# Patient Record
Sex: Female | Born: 1975 | Race: Black or African American | Hispanic: No | Marital: Single | Smoking: Former smoker
Health system: Southern US, Community
[De-identification: ages and names within clinical notes are randomized; demographics above are authoritative.]

## PROBLEM LIST (undated history)

## (undated) DIAGNOSIS — I10 Essential (primary) hypertension: Secondary | ICD-10-CM

## (undated) DIAGNOSIS — F329 Major depressive disorder, single episode, unspecified: Secondary | ICD-10-CM

## (undated) DIAGNOSIS — R221 Localized swelling, mass and lump, neck: Secondary | ICD-10-CM

## (undated) DIAGNOSIS — F32A Depression, unspecified: Secondary | ICD-10-CM

## (undated) DIAGNOSIS — D649 Anemia, unspecified: Secondary | ICD-10-CM

## (undated) DIAGNOSIS — R0600 Dyspnea, unspecified: Secondary | ICD-10-CM

## (undated) DIAGNOSIS — E039 Hypothyroidism, unspecified: Secondary | ICD-10-CM

## (undated) DIAGNOSIS — G4730 Sleep apnea, unspecified: Secondary | ICD-10-CM

## (undated) DIAGNOSIS — E079 Disorder of thyroid, unspecified: Secondary | ICD-10-CM

## (undated) DIAGNOSIS — K219 Gastro-esophageal reflux disease without esophagitis: Secondary | ICD-10-CM

## (undated) DIAGNOSIS — E119 Type 2 diabetes mellitus without complications: Secondary | ICD-10-CM

## (undated) HISTORY — PX: TONSILLECTOMY: SUR1361

## (undated) HISTORY — DX: Localized swelling, mass and lump, neck: R22.1

## (undated) HISTORY — PX: TOTAL THYROIDECTOMY: SHX2547

---

## 2004-04-25 ENCOUNTER — Ambulatory Visit: Payer: Self-pay | Admitting: Otolaryngology

## 2004-06-19 ENCOUNTER — Ambulatory Visit: Payer: Self-pay | Admitting: Otolaryngology

## 2004-06-29 ENCOUNTER — Ambulatory Visit: Payer: Self-pay | Admitting: Otolaryngology

## 2004-07-16 ENCOUNTER — Emergency Department: Payer: Self-pay | Admitting: Emergency Medicine

## 2005-09-29 ENCOUNTER — Emergency Department: Payer: Self-pay | Admitting: Internal Medicine

## 2006-02-27 ENCOUNTER — Ambulatory Visit: Payer: Self-pay

## 2006-04-07 ENCOUNTER — Observation Stay: Payer: Self-pay | Admitting: Unknown Physician Specialty

## 2006-04-19 ENCOUNTER — Inpatient Hospital Stay: Payer: Self-pay | Admitting: Unknown Physician Specialty

## 2006-08-05 ENCOUNTER — Emergency Department: Payer: Self-pay | Admitting: Emergency Medicine

## 2007-09-15 ENCOUNTER — Emergency Department: Payer: Self-pay | Admitting: Emergency Medicine

## 2007-11-25 ENCOUNTER — Emergency Department: Payer: Self-pay | Admitting: Emergency Medicine

## 2008-10-11 ENCOUNTER — Emergency Department: Payer: Self-pay | Admitting: Unknown Physician Specialty

## 2011-05-02 ENCOUNTER — Emergency Department: Payer: Self-pay | Admitting: Emergency Medicine

## 2011-10-10 ENCOUNTER — Emergency Department: Payer: Self-pay | Admitting: Emergency Medicine

## 2011-12-29 ENCOUNTER — Emergency Department: Payer: Self-pay | Admitting: Emergency Medicine

## 2012-07-16 DIAGNOSIS — R221 Localized swelling, mass and lump, neck: Secondary | ICD-10-CM

## 2012-07-16 HISTORY — DX: Localized swelling, mass and lump, neck: R22.1

## 2012-07-16 HISTORY — PX: NECK MASS EXCISION: SHX2079

## 2012-07-16 HISTORY — PX: CHOLECYSTECTOMY: SHX55

## 2012-10-09 ENCOUNTER — Emergency Department: Payer: Self-pay | Admitting: Emergency Medicine

## 2012-10-09 LAB — CBC
HCT: 31.2 % — ABNORMAL LOW (ref 35.0–47.0)
MCH: 19.6 pg — ABNORMAL LOW (ref 26.0–34.0)
MCHC: 30.2 g/dL — ABNORMAL LOW (ref 32.0–36.0)
MCV: 65 fL — ABNORMAL LOW (ref 80–100)
WBC: 8.3 10*3/uL (ref 3.6–11.0)

## 2012-10-09 LAB — COMPREHENSIVE METABOLIC PANEL
Albumin: 3.1 g/dL — ABNORMAL LOW (ref 3.4–5.0)
Bilirubin,Total: 0.2 mg/dL (ref 0.2–1.0)
Calcium, Total: 7.9 mg/dL — ABNORMAL LOW (ref 8.5–10.1)
Chloride: 105 mmol/L (ref 98–107)
Creatinine: 0.6 mg/dL (ref 0.60–1.30)
EGFR (African American): >60
Glucose: 62 mg/dL — ABNORMAL LOW (ref 65–99)
Osmolality: 269 (ref 275–301)
SGOT(AST): 17 U/L (ref 15–37)
SGPT (ALT): 17 U/L (ref 12–78)
Total Protein: 8.2 g/dL (ref 6.4–8.2)

## 2012-10-09 LAB — TROPONIN I
Troponin-I: <0.02 ng/mL
Troponin-I: <0.02 ng/mL

## 2012-10-09 LAB — CK TOTAL AND CKMB (NOT AT ARMC)
CK, Total: 128 U/L (ref 21–215)
CK-MB: <0.5 ng/mL — ABNORMAL LOW (ref 0.5–3.6)
CK-MB: <0.5 ng/mL — ABNORMAL LOW (ref 0.5–3.6)

## 2012-10-22 ENCOUNTER — Ambulatory Visit: Payer: Self-pay | Admitting: Emergency Medicine

## 2012-10-22 LAB — HEPATIC FUNCTION PANEL A (ARMC)
Albumin: 3.1 g/dL — ABNORMAL LOW (ref 3.4–5.0)
Alkaline Phosphatase: 92 U/L (ref 50–136)
Bilirubin, Direct: <0.1 mg/dL (ref 0.00–0.20)
Bilirubin,Total: 0.2 mg/dL (ref 0.2–1.0)
SGOT(AST): 16 U/L (ref 15–37)
SGPT (ALT): 14 U/L (ref 12–78)
Total Protein: 7.5 g/dL (ref 6.4–8.2)

## 2012-10-28 ENCOUNTER — Ambulatory Visit: Payer: Self-pay | Admitting: Emergency Medicine

## 2012-11-12 ENCOUNTER — Ambulatory Visit: Payer: Self-pay | Admitting: Unknown Physician Specialty

## 2012-11-18 ENCOUNTER — Ambulatory Visit: Payer: Self-pay | Admitting: Unknown Physician Specialty

## 2012-11-19 LAB — PATHOLOGY REPORT

## 2012-12-04 ENCOUNTER — Ambulatory Visit: Payer: Self-pay | Admitting: Emergency Medicine

## 2012-12-04 LAB — HEPATIC FUNCTION PANEL A (ARMC)
Albumin: 3.1 g/dL — ABNORMAL LOW (ref 3.4–5.0)
Alkaline Phosphatase: 91 U/L (ref 50–136)
SGOT(AST): 13 U/L — ABNORMAL LOW (ref 15–37)
Total Protein: 7.9 g/dL (ref 6.4–8.2)

## 2012-12-11 ENCOUNTER — Inpatient Hospital Stay: Payer: Self-pay | Admitting: Surgery

## 2012-12-12 LAB — CREATININE, SERUM: EGFR (Non-African Amer.): >60

## 2012-12-15 LAB — PATHOLOGY REPORT

## 2012-12-30 ENCOUNTER — Encounter: Payer: Self-pay | Admitting: General Surgery

## 2012-12-30 ENCOUNTER — Other Ambulatory Visit: Payer: Self-pay | Admitting: *Deleted

## 2012-12-30 ENCOUNTER — Other Ambulatory Visit: Payer: Self-pay | Admitting: General Surgery

## 2012-12-30 ENCOUNTER — Ambulatory Visit: Payer: Medicaid Other | Admitting: General Surgery

## 2012-12-30 VITALS — BP 130/74 | HR 74 | Resp 16 | Ht 65.0 in | Wt 338.0 lb

## 2012-12-30 DIAGNOSIS — R1011 Right upper quadrant pain: Secondary | ICD-10-CM

## 2012-12-30 DIAGNOSIS — R109 Unspecified abdominal pain: Secondary | ICD-10-CM

## 2012-12-30 LAB — CBC WITH DIFFERENTIAL/PLATELET
Basophil #: 0 10*3/uL (ref 0.0–0.1)
Basophil %: 0.5 %
Eosinophil #: 0.2 10*3/uL (ref 0.0–0.7)
Eosinophil %: 3.1 %
HCT: 27.7 % — ABNORMAL LOW (ref 35.0–47.0)
HGB: 8.6 g/dL — ABNORMAL LOW (ref 12.0–16.0)
Lymphocyte #: 2.5 10*3/uL (ref 1.0–3.6)
Lymphocyte %: 40 %
MCHC: 31.1 g/dL — ABNORMAL LOW (ref 32.0–36.0)
Monocyte #: 0.3 x10 3/mm (ref 0.2–0.9)
Monocyte %: 5.3 %
Neutrophil #: 3.2 10*3/uL (ref 1.4–6.5)
Neutrophil %: 51.1 %
Platelet: 364 10*3/uL (ref 150–440)
RDW: 18.9 % — ABNORMAL HIGH (ref 11.5–14.5)

## 2012-12-30 LAB — HEPATIC FUNCTION PANEL A (ARMC)
Bilirubin, Direct: <0.1 mg/dL (ref 0.00–0.20)
Bilirubin,Total: 0.2 mg/dL (ref 0.2–1.0)
SGPT (ALT): 15 U/L (ref 12–78)

## 2012-12-30 LAB — LIPASE, BLOOD: Lipase: 120 U/L (ref 73–393)

## 2012-12-30 NOTE — Patient Instructions (Addendum)
Follow up labs at [facility] and if needed ultrasound Will call with results

## 2012-12-30 NOTE — Progress Notes (Signed)
Patient ID: Sierra Pineda, female   DOB: 03/22/1976, 37 y.o.   MRN: [id number]  Chief Complaint  Patient presents with  . Routine Post Op    Dr Cecelia Byars    HPI Sierra Pineda is a 37 y.o. female who presents as a new patient. The patient had open gallbladder surgery performed on 12/11/12. Drain was removed with postop visit with Dr Cecelia Byars. States she was doing good until increased pain few days ago. 3 days ago started with throbbing constant pain. Denies fever, chills,nausea or vomiting.  Also, having shoulder discomfort. No nausea or vomiting, no fever or chills HPI  Past Medical History  Diagnosis Date  . Neck mass 2014    Past Surgical History  Procedure Laterality Date  . Neck mass excision  2014    Dr Willeen Cass  . Cholecystectomy  2014    Dr Cecelia Byars    History reviewed. No pertinent family history.  Social History History  Substance Use Topics  . Smoking status: Former Smoker    Quit date: 09/13/2012  . Smokeless tobacco: Not on file  . Alcohol Use: No    Allergies  Allergen Reactions  . Other     chocolate  . Tylenol (Acetaminophen) Rash and Other (See Comments)    Yeast Infection    Current Outpatient Prescriptions  Medication Sig Dispense Refill  . levothyroxine (SYNTHROID, LEVOTHROID) 100 MCG tablet Take 100 mcg by mouth daily before breakfast.       No current facility-administered medications for this visit.    Review of Systems Review of Systems  Constitutional: Negative.   Respiratory: Negative.   Cardiovascular: Negative.   Gastrointestinal: Positive for abdominal pain.    Blood pressure 130/74, pulse 74, resp. rate 16, height 5\' 5"  (1.651 m), weight 338 lb (153.316 kg), last menstrual period 12/22/2012.  Physical Exam Physical Exam  Constitutional: She is oriented to person, place, and time. She appears well-developed and well-nourished.  Eyes: Conjunctivae are normal. No scleral icterus.  Cardiovascular: Normal rate and regular rhythm.    Pulmonary/Chest: Effort normal and breath sounds normal.  Abdominal: There is tenderness in the right upper quadrant.  Neurological: She is alert and oriented to person, place, and time.  Skin: Skin is warm and dry.   Incision clean to abdomen Data Reviewed OP note reviewed-dificult surgery due to pt size and marked acute changes. IOC not done.  Assessment    Abd pain, 2-3 weeks post cholecystectomy.     Plan          Patient to have the following labs drawn at [facility]: CBC, Hepatic Function Panel, and Lipase. This patient will be contacted once results are available. She is aware of all instructions.    SANKAR,SEEPLAPUTHUR G 12/30/2012, 11:23 AM

## 2012-12-30 NOTE — Progress Notes (Signed)
Patient was contacted today to let her know Dr. Evette Cristal did review labs completed at [facility] earlier today. Dr. Evette Cristal is recommending that patient have a limited abdominal ultrasound attention: gallbladder. This has been arranged at [facility] for 12-31-12 at 8:30 am (arrive 8:15 am). Prep: nothing to eat or drink after midnight tonight. She is aware of all instructions.

## 2012-12-31 ENCOUNTER — Telehealth: Payer: Self-pay | Admitting: General Surgery

## 2012-12-31 ENCOUNTER — Ambulatory Visit: Payer: Self-pay | Admitting: General Surgery

## 2012-12-31 NOTE — Telephone Encounter (Signed)
Pt's labs showed Hgb of 8.6-could not locate one preop. LFTs and Lipase normal. US showed no ductal dilatation , no ascites or fluid collections. Pt advised -no apparent findings for her pain. Likely this is incisional. Advised use of Ibuprofen for 4-5 days. Call for any worsening or new symptoms. Return to work delayed by 1 week.

## 2013-01-04 NOTE — Progress Notes (Signed)
Lipase was reported with chemistry panel. This is not overdue

## 2013-12-02 ENCOUNTER — Ambulatory Visit: Payer: Self-pay | Admitting: Family Medicine

## 2014-02-05 ENCOUNTER — Ambulatory Visit: Payer: Self-pay | Admitting: Primary Care

## 2014-05-17 ENCOUNTER — Encounter: Payer: Self-pay | Admitting: General Surgery

## 2014-11-05 NOTE — Op Note (Signed)
PATIENT NAME:  Sierra Pineda, SCHLUETER MR#:  [id number] DATE OF BIRTH:  September 23, 1975  DATE OF PROCEDURE:  12/11/2012  PREOPERATIVE DIAGNOSES:  1. Acute cholecystitis.  2. Obesity.   OPERATION: Attempted laparoscopic but then could not do it and did open cholecystectomy.   SURGEON: Hetal Proano S. Cecelia Byars, M.D.   INDICATION FOR SURGERY: This patient, who is over 350 pounds, is an obese woman. She had also very large goiter and the previous surgery she underwent resection of her thyroid gland so because she was having a lot of pressure symptoms on her trachea. She was then brought to surgery because she was having right upper quadrant abdominal pain and had gallstones. The patient is very massively obese, and I told her that there is a problem, that I might not be able to enter the abdomen through for laparoscopic procedure because there is so thick fat around the bellybutton.   DESCRIPTION OF PROCEDURE: So, the patient was then brought to surgery. After she was put to sleep, a small incision was made above the umbilicus. After cutting skin and subcutaneous tissue, we kept on dissecting down to the fascia. We could not even reach the fascia. Then, I tried with a Veress needle also, but I was not sure whether it was going into the abdomen or not. Because of the size of the abdomen, it was not clicking, so I tried to use the other trocar but I was afraid to push it because I was not very sure that it was at the right place. So, I decided because of the obesity and because of this, it was better to do the open cholecystectomy. I made an incision in the right upper quadrant. After cutting skin and subcutaneous tissue, the patient had tremendous fat. I dissected down to the fascia, opened the fascia, opened the peritoneum. After entering the abdomen, the patient had a very large liver, and the gallbladder was way down. It was very difficult to even remove. A lot of omentum was stuck. It was dissected off the gallbladder and  pushed down, and Hartmann retractor was then put in and slowly and slowly dissection was done from the liver bed. I went slowly and kept using Bovie until I reached the lower end of the gallbladder, and I made a hole in the gallbladder and lots of stones fell out. Dissected down to the cystic arteries, which were clipped, and way down into the gallbladder Hartmann pouch area and it was really one of a difficult surgery. It was very, very deep. I clamped gall bladder below hartman pouch  and took out the rest of the stones and then made sure that we were not near the common duct and then we put 2 clips. Then, I also put a tie of 0 chromic on a needlestick because it was too low to even tie with hand, but I did a stick tie and then tied it with had. Made sure there was no leakage. The oozing from the liver was stopped with pressure and Bovie. Irrigation of the abdomen was performed which showed there was no biliary leak and no bleeding. Then, I put a drain there brought out from a separate stab wound. The peritoneum was then closed with running Vicryl after nurses told me the sponge count was correct. Then, the fascia was closed with interrupted 0 Vicryl sutures, skin with skin clips. The umbilical port was also then closed.   SUMMARY OF THE CASE: The patient was difficult just because of her  massive obesity and very deep amount of fat under the skin and subcutaneous tissue and even open gallbladder was very difficult to do because of the size of the patient. In the end, the patient did very well and was sent to the recovery room in satisfactory condition.   ____________________________ Alton RevereMasud S. Cecelia ByarsHashmi, MD msh:gb D: 12/11/2012 14:20:43 ET T: 12/12/2012 01:50:41 ET JOB#: [id number]  cc: Sully Manzi S. Cecelia ByarsHashmi, MD, <Dictator> Meryle ReadyMASUD S Cledith Kamiya MD ELECTRONICALLY SIGNED 01/14/2013 16:15

## 2014-11-05 NOTE — Op Note (Signed)
PATIENT NAME:  Sierra Pineda, Sierra Pineda MR#:  [id number] DATE OF BIRTH:  25-Feb-1976  DATE OF PROCEDURE:  11/18/2012  PREOPERATIVE DIAGNOSES: Diffuse goiter.   POSTOPERATIVE DIAGNOSIS:  Diffuse goiter.  PROCEDURE PERFORMED: Total thyroidectomy with substernal component.  SURGEON:  Linus Salmonshapman Lashaunta Sicard, MD  ASSISTANT:  Bud Facereighton Vaught, MD  OPERATIVE FINDINGS: A large goiter involving the majority of the left lobe of the thyroid which measured approximately 9 x 10 cm, right lobe of the thyroid relatively more normal at 2 x 2.5 cm.   DESCRIPTION OF PROCEDURE: Helmut Musterlicia was identified in the holding area and taken to the operating room and placed in the supine position. After general endotracheal anesthesia with a laryngeal monitoring endotracheal tube, the patient's neck was gently extended. An incision line was marked along a skin crease.  A local anesthetic of 1% lidocaine with 1:100,000 units of epinephrine was used to inject along this incision line. A total of 9 mL was used. With the neck prepped and draped sterilely, a 15 blade was used to incise down to and through the platysma muscle. Hemostasis was achieved using the Bovie cautery.  Superiorly and inferiorly based subplatysmal flaps were then elevated. The large left lobe of the thyroid was compressing the trachea over to the right. The strap muscle midline was identified. This was further over to the right than usual and the strap muscles were divided in the midline and beginning on the left-hand side were retracted laterally. There was a very large mass involving the entire left lobe of the thyroid.  Gentle dissection was taken around this. It was evident that there was no way that this large goiter could be pulled through the strap muscles; therefore, it was decided the strap muscles would be divided. Therefore, clamps were placed on the strap muscles and the Bovie was used to divide these. The clamps were left in position for reapproximation. This gave  excellent exposure to the gland.  Careful dissection was then taken over the gland. This was dissected inferiorly and indeed had a substernal component. There were several fibrous bands which were divided using the Harmonic scalpel. The gland was gently medialized and brought into the wound. It measured approximately 10 x 9 cm with a nice capsule around it.  As the gland was medialized, again there were several feeding vessels which were divided using the Harmonic scalpel. As the gland was more medialized, the superior and inferior parathyroid glands were identified and preserved on their vascular pedicles. The recurrent laryngeal nerve was identified in the tracheoesophageal groove. This was stimulated and remained intact throughout the case. The superior pole vessels were then isolated and divided using the Harmonic scalpel which allowed this large left lobe of the gland to be peeled over the anterior wall of the trachea. Berry's ligament was divided using the microbipolar and the gland was more medialized. It was felt in order to get the right lobe out with this very large mass on the left-hand side that we would divide the isthmus. Therefore, the Harmonic was used to divide the gland and separate it at the isthmus. With the left lobe removed, a stitch was placed in the superior pole for marking. Once this was removed, the trachea came back to the midline which gave excellent exposure to the right lobe.  As the strap muscles were retracted on the right, the lateral aspect of the gland was identified. The superior pole vessels were identified, isolated and divided using the Harmonic scalpel. There were multiple small feeding  vessels into the gland, which were also divided using the Harmonic scalpel. As the gland was gently medialized, the superior and inferior parathyroid glands again were identified, peeled gently off the gland and left on their vascular pedicles.  The recurrent laryngeal nerve was identified in  the tracheoesophageal groove. This was stimulated and remained intact throughout the case. Several small vessels again were headed into the gland just lateral to the nerve.  These were divided using the Harmonic scalpel. The gland was then retracted medially, Berry's ligament was divided, and the right lobe of the thyroid plus the isthmus was divided and dissected off the anterior wall of the trachea. With the gland removed, the wound was copiously irrigated with saline. Any small bleeding points were cauterized using the microbipolar. Surgicel was then placed in the neurovascular bed bilaterally. Prior to this, both recurrent laryngeal nerves were stimulated and reacted normally.  On the left-hand side, where the strap muscles had been divided, these muscles were reapproximated using interrupted 4-0 Vicryls.  Two #10 TLS drains were brought out of the wound inferiorly. The strap muscles were then reapproximated in the midline anteriorly.  The platysmal layer was closed using 4-0 Vicryl. The subcutaneous tissues were also closed using 4-0 Vicryl, and the skin was closed using Dermabond. Tegaderms were then used to affix the TLS drains inferiorly. The patient was then returned to anesthesia where she was awakened in the operating room and taken to the recovery room in stable condition.   CULTURES: None.          SPECIMENS: Total thyroid gland.   ESTIMATED BLOOD LOSS: Less than 20 mL. ____________________________ Davina Poke, MD ctm:sb D: 11/18/2012 09:29:23 ET T: 11/18/2012 09:43:29 ET JOB#: [id number]  cc: Davina Poke, MD, <Dictator> Davina Poke MD ELECTRONICALLY SIGNED 11/28/2012 7:36

## 2015-10-10 ENCOUNTER — Emergency Department
Admission: EM | Admit: 2015-10-10 | Discharge: 2015-10-10 | Disposition: A | Discharge status: Home / Self Care | Payer: Self-pay | Attending: Emergency Medicine | Admitting: Emergency Medicine

## 2015-10-10 ENCOUNTER — Emergency Department: Payer: Self-pay

## 2015-10-10 ENCOUNTER — Encounter: Payer: Self-pay | Admitting: Emergency Medicine

## 2015-10-10 DIAGNOSIS — E119 Type 2 diabetes mellitus without complications: Secondary | ICD-10-CM | POA: Insufficient documentation

## 2015-10-10 DIAGNOSIS — R42 Dizziness and giddiness: Secondary | ICD-10-CM | POA: Insufficient documentation

## 2015-10-10 DIAGNOSIS — F439 Reaction to severe stress, unspecified: Secondary | ICD-10-CM | POA: Insufficient documentation

## 2015-10-10 DIAGNOSIS — I10 Essential (primary) hypertension: Secondary | ICD-10-CM | POA: Insufficient documentation

## 2015-10-10 DIAGNOSIS — R109 Unspecified abdominal pain: Secondary | ICD-10-CM | POA: Insufficient documentation

## 2015-10-10 DIAGNOSIS — R202 Paresthesia of skin: Secondary | ICD-10-CM | POA: Insufficient documentation

## 2015-10-10 DIAGNOSIS — Z87891 Personal history of nicotine dependence: Secondary | ICD-10-CM | POA: Insufficient documentation

## 2015-10-10 DIAGNOSIS — Z79899 Other long term (current) drug therapy: Secondary | ICD-10-CM | POA: Insufficient documentation

## 2015-10-10 HISTORY — DX: Essential (primary) hypertension: I10

## 2015-10-10 HISTORY — DX: Type 2 diabetes mellitus without complications: E11.9

## 2015-10-10 LAB — CBC
HCT: 35.7 % (ref 35.0–47.0)
HEMOGLOBIN: 11.5 g/dL — AB (ref 12.0–16.0)
MCH: 24.1 pg — ABNORMAL LOW (ref 26.0–34.0)
MCHC: 32.3 g/dL (ref 32.0–36.0)
MCV: 74.6 fL — ABNORMAL LOW (ref 80.0–100.0)
Platelets: 276 10*3/uL (ref 150–440)
RBC: 4.78 MIL/uL (ref 3.80–5.20)
RDW: 16.5 % — ABNORMAL HIGH (ref 11.5–14.5)
WBC: 7.3 10*3/uL (ref 3.6–11.0)

## 2015-10-10 LAB — URINALYSIS COMPLETE WITH MICROSCOPIC (ARMC ONLY)
Bilirubin Urine: NEGATIVE
Glucose, UA: NEGATIVE mg/dL
KETONES UR: NEGATIVE mg/dL
Leukocytes, UA: NEGATIVE
NITRITE: NEGATIVE
PH: 6 (ref 5.0–8.0)
PROTEIN: NEGATIVE mg/dL
SPECIFIC GRAVITY, URINE: 1.019 (ref 1.005–1.030)

## 2015-10-10 LAB — BASIC METABOLIC PANEL
ANION GAP: 4 — AB (ref 5–15)
BUN: 8 mg/dL (ref 6–20)
CALCIUM: 8.3 mg/dL — AB (ref 8.9–10.3)
CO2: 29 mmol/L (ref 22–32)
Chloride: 102 mmol/L (ref 101–111)
Creatinine, Ser: 0.93 mg/dL (ref 0.44–1.00)
Glucose, Bld: 157 mg/dL — ABNORMAL HIGH (ref 65–99)
Potassium: 3.6 mmol/L (ref 3.5–5.1)
Sodium: 135 mmol/L (ref 135–145)

## 2015-10-10 LAB — GLUCOSE, CAPILLARY: Glucose-Capillary: 164 mg/dL — ABNORMAL HIGH (ref 65–99)

## 2015-10-10 MED ORDER — GI COCKTAIL ~~LOC~~
30.0000 mL | Freq: Once | ORAL | Status: AC
  Administered 2015-10-10: 30 mL via ORAL
  Filled 2015-10-10: qty 30

## 2015-10-10 MED ORDER — MECLIZINE HCL 25 MG PO TABS: 25.0000 mg | ORAL_TABLET | Freq: Three times a day (TID) | ORAL | Status: DC | PRN

## 2015-10-10 MED ORDER — MECLIZINE HCL 25 MG PO TABS
25.0000 mg | ORAL_TABLET | Freq: Once | ORAL | Status: AC
  Administered 2015-10-10: 25 mg via ORAL
  Filled 2015-10-10: qty 1

## 2015-10-10 NOTE — ED Provider Notes (Signed)
[facility] Emergency Department Provider Note  ____________________________________________    I have reviewed the triage vital signs and the nursing notes.   HISTORY  Chief Complaint Dizziness and Numbness    HPI Sierra Pineda is a 40 y.o. female presents with multiple complaints. She reports she has had a sensation of room spinning which started today. She denies headaches. No neuro deficits. No history of vertigo in the past. She is not taking anything for this. She also complains of a tingling sensation in her abdomen area. She denies nausea or vomiting. 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 She feels this has something to do with her symptoms     Past Medical History  Diagnosis Date  . Neck mass 2014  . Diabetes mellitus without complication (HCC)   . Hypertension     Patient Active Problem List   Diagnosis Date Noted  . Abdominal pain, right upper quadrant 12/30/2012    Past Surgical History  Procedure Laterality Date  . Neck mass excision  2014    Dr Willeen Cass  . Cholecystectomy  2014    Dr Cecelia Byars    Current Outpatient Rx  Name  Route  Sig  Dispense  Refill  . levothyroxine (SYNTHROID, LEVOTHROID) 100 MCG tablet   Oral   Take 100 mcg by mouth daily before breakfast.         . meclizine (ANTIVERT) 25 MG tablet   Oral   Take 1 tablet (25 mg total) by mouth 3 (three) times daily as needed for dizziness.   20 tablet   0     Allergies Other and Tylenol  No family history on file.  Social History Social History  Substance Use Topics  . Smoking status: Former Smoker    Quit date: 09/13/2012  . Smokeless tobacco: None  . Alcohol Use: No    Review of Systems  Constitutional: Negative for fever. Eyes: Negative for redness ENT: Negative for sore throat Cardiovascular: Negative for chest pain Respiratory: Negative for shortness of  breath. Gastrointestinal:Tingling sensation as above Genitourinary: Negative for dysuria. Musculoskeletal: Negative for back pain. Skin: Negative for rash. Neurological: Negative for focal weakness. Positive dizziness as above Psychiatric: Positive stress    ____________________________________________   PHYSICAL EXAM:  VITAL SIGNS: ED Triage Vitals  Enc Vitals Group     BP 10/10/15 1645 160/96 mmHg     Pulse Rate 10/10/15 1645 92     Resp 10/10/15 1645 18     Temp 10/10/15 1645 98.9 F (37.2 C)     Temp Source 10/10/15 1645 Oral     SpO2 10/10/15 1645 100 %     Weight 10/10/15 1645 370 lb (167.831 kg)     Height 10/10/15 1645  (1.651 Pineda)     Head Cir --      Peak Flow --      Pain Score 10/10/15 1645 7     Pain Loc --      Pain Edu? --      Excl. in GC? --      Constitutional: Alert and oriented. Well appearing and in no distress.  Eyes: Conjunctivae are normal. No erythema or injection. No nystagmus ENT   Head: Normocephalic and atraumatic.   Mouth/Throat: Mucous membranes are moist. Cardiovascular: Normal rate, regular rhythm. Normal and symmetric distal pulses are present in the upper extremities.  Respiratory: Normal respiratory effort without tachypnea  nor retractions. Breath sounds are clear and equal bilaterally.  Gastrointestinal: Soft and non-tender in all quadrants. No distention. There is no CVA tenderness. Genitourinary: deferred Musculoskeletal: Nontender with normal range of motion in all extremities. No lower extremity tenderness nor edema. Neurologic:  Normal speech and language. No gross focal neurologic deficits are appreciated. Cranial nerves II-12 are normal. Normal gait. Skin:  Skin is warm, dry and intact. No rash noted. Psychiatric: Mood and affect are normal. Patient exhibits appropriate insight and judgment.  ____________________________________________    LABS (pertinent positives/negatives)  Labs Reviewed  BASIC METABOLIC  PANEL - Abnormal; Notable for the following:    Glucose, Bld 157 (*)    Calcium 8.3 (*)    Anion gap 4 (*)    All other components within normal limits  CBC - Abnormal; Notable for the following:    Hemoglobin 11.5 (*)    MCV 74.6 (*)    MCH 24.1 (*)    RDW 16.5 (*)    All other components within normal limits  URINALYSIS COMPLETEWITH MICROSCOPIC ([facility] ONLY) - Abnormal; Notable for the following:    Color, Urine YELLOW (*)    APPearance HAZY (*)    Hgb urine dipstick 1+ (*)    Bacteria, UA RARE (*)    Squamous Epithelial / LPF 0-5 (*)    All other components within normal limits  GLUCOSE, CAPILLARY - Abnormal; Notable for the following:    Glucose-Capillary 164 (*)    All other components within normal limits  PREGNANCY, URINE  CBG MONITORING, ED    ____________________________________________   EKG  ED ECG REPORT I, Sierra EveryKINNER, Vineeth Fell, the attending physician, personally viewed and interpreted this ECG.  Date: 10/10/2015 EKG Time: 4:52 PM Rate: 82 Rhythm: normal sinus rhythm QRS Axis: normal Intervals: normal ST/T Wave abnormalities: normal Conduction Disturbances: none Narrative Interpretation: unremarkable   ____________________________________________    RADIOLOGY  CT head is unremarkable. Donnatal x-ray is normal  ____________________________________________   PROCEDURES  Procedure(s) performed: none  Critical Care performed: none  ____________________________________________   INITIAL IMPRESSION / ASSESSMENT AND PLAN / ED COURSE  Pertinent labs & imaging results that were available during my care of the patient were reviewed by me and considered in my medical decision making (see chart for details).  Patient well-appearing. She has had resolution of her vertiginous symptoms after meclizine. CT head is unremarkable. Neuro exam is benign. Lab work is unremarkable. No abdominal tender to palpation. X-ray of abdomen shows no acute distress. Patient  feels her symptoms are stress related and most certainly possible. At this time she is feeling well and is stable for discharge.  ____________________________________________   FINAL CLINICAL IMPRESSION(S) / ED DIAGNOSES  Final diagnoses:  Abdominal cramping  Vertigo  Stress at home          Sierra Everyobert Jesseca Marsch, MD 10/11/15 [phone]

## 2015-10-10 NOTE — ED Notes (Signed)
Pt here with c/o dizziness "like the room is spinning" also c/o "numbness in her stomach." Pt appears in no distress. Grips strong and equal bilaterally, negative for arm droop, face symmetrical, speech clear. Pt appears in no distress.

## 2015-10-10 NOTE — Discharge Instructions (Signed)
Abdominal Pain, Adult Many things can cause abdominal pain. Usually, abdominal pain is not caused by a disease and will improve without treatment. It can often be observed and treated at home. Your health care provider will do a physical exam and possibly order blood tests and X-rays to help determine the seriousness of your pain. However, in many cases, more time must pass before a clear cause of the pain can be found. Before that point, your health care provider may not know if you need more testing or further treatment. HOME CARE INSTRUCTIONS Monitor your abdominal pain for any changes. The following actions may help to alleviate any discomfort you are experiencing:  Only take over-the-counter or prescription medicines as directed by your health care provider.  Do not take laxatives unless directed to do so by your health care provider.  Try a clear liquid diet (broth, tea, or water) as directed by your health care provider. Slowly move to a bland diet as tolerated. SEEK MEDICAL CARE IF:  You have unexplained abdominal pain.  You have abdominal pain associated with nausea or diarrhea.  You have pain when you urinate or have a bowel movement.  You experience abdominal pain that wakes you in the night.  You have abdominal pain that is worsened or improved by eating food.  You have abdominal pain that is worsened with eating fatty foods.  You have a fever. SEEK IMMEDIATE MEDICAL CARE IF:  Your pain does not go away within 2 hours.  You keep throwing up (vomiting).  Your pain is felt only in portions of the abdomen, such as the right side or the left lower portion of the abdomen.  You pass bloody or black tarry stools. MAKE SURE YOU:  Understand these instructions.  Will watch your condition.  Will get help right away if you are not doing well or get worse.   This information is not intended to replace advice given to you by your health care provider. Make sure you discuss  any questions you have with your health care provider.   Document Released: 04/11/2005 Document Revised: 03/23/2015 Document Reviewed: 03/11/2013 Elsevier Interactive Patient Education 2016 [employer]  Benign Positional Vertigo Vertigo is the feeling that you or your surroundings are moving when they are not. Benign positional vertigo is the most common form of vertigo. The cause of this condition is not serious (is benign). This condition is triggered by certain movements and positions (is positional). This condition can be dangerous if it occurs while you are doing something that could endanger you or others, such as driving.  CAUSES In many cases, the cause of this condition is not known. It may be caused by a disturbance in an area of the inner ear that helps your brain to sense movement and balance. This disturbance can be caused by a viral infection (labyrinthitis), head injury, or repetitive motion. RISK FACTORS This condition is more likely to develop in:  Women.  People who are 41 years of age or older. SYMPTOMS Symptoms of this condition usually happen when you move your head or your eyes in different directions. Symptoms may start suddenly, and they usually last for less than a minute. Symptoms may include:  Loss of balance and falling.  Feeling like you are spinning or moving.  Feeling like your surroundings are spinning or moving.  Nausea and vomiting.  Blurred vision.  Dizziness.  Involuntary eye movement (nystagmus). Symptoms can be mild and cause only slight annoyance, or they can be  severe and interfere with daily life. Episodes of benign positional vertigo may return (recur) over time, and they may be triggered by certain movements. Symptoms may improve over time. DIAGNOSIS This condition is usually diagnosed by medical history and a physical exam of the head, neck, and ears. You may be referred to a health care provider who specializes in ear, nose, and throat  (ENT) problems (otolaryngologist) or a provider who specializes in disorders of the nervous system (neurologist). You may have additional testing, including:  MRI.  A CT scan.  Eye movement tests. Your health care provider may ask you to change positions quickly while he or she watches you for symptoms of benign positional vertigo, such as nystagmus. Eye movement may be tested with an electronystagmogram (ENG), caloric stimulation, the Dix-Hallpike test, or the roll test.  An electroencephalogram (EEG). This records electrical activity in your brain.  Hearing tests. TREATMENT Usually, your health care provider will treat this by moving your head in specific positions to adjust your inner ear back to normal. Surgery may be needed in severe cases, but this is rare. In some cases, benign positional vertigo may resolve on its own in 2-4 weeks. HOME CARE INSTRUCTIONS Safety  Move slowly.Avoid sudden body or head movements.  Avoid driving.  Avoid operating heavy machinery.  Avoid doing any tasks that would be dangerous to you or others if a vertigo episode would occur.  If you have trouble walking or keeping your balance, try using a cane for stability. If you feel dizzy or unstable, sit down right away.  Return to your normal activities as told by your health care provider. Ask your health care provider what activities are safe for you. General Instructions  Take over-the-counter and prescription medicines only as told by your health care provider.  Avoid certain positions or movements as told by your health care provider.  Drink enough fluid to keep your urine clear or pale yellow.  Keep all follow-up visits as told by your health care provider. This is important. SEEK MEDICAL CARE IF:  You have a fever.  Your condition gets worse or you develop new symptoms.  Your family or friends notice any behavioral changes.  Your nausea or vomiting gets worse.  You have numbness or a  "pins and needles" sensation. SEEK IMMEDIATE MEDICAL CARE IF:  You have difficulty speaking or moving.  You are always dizzy.  You faint.  You develop severe headaches.  You have weakness in your legs or arms.  You have changes in your hearing or vision.  You develop a stiff neck.  You develop sensitivity to light.   This information is not intended to replace advice given to you by your health care provider. Make sure you discuss any questions you have with your health care provider.   Document Released: 04/09/2006 Document Revised: 03/23/2015 Document Reviewed: 10/25/2014 Elsevier Interactive Patient Education [employer]

## 2015-11-28 ENCOUNTER — Emergency Department
Admission: EM | Admit: 2015-11-28 | Discharge: 2015-11-28 | Disposition: A | Discharge status: Home / Self Care | Payer: Medicaid Other | Attending: Emergency Medicine | Admitting: Emergency Medicine

## 2015-11-28 ENCOUNTER — Emergency Department: Payer: Medicaid Other

## 2015-11-28 ENCOUNTER — Encounter: Payer: Self-pay | Admitting: Emergency Medicine

## 2015-11-28 DIAGNOSIS — R252 Cramp and spasm: Secondary | ICD-10-CM | POA: Diagnosis not present

## 2015-11-28 DIAGNOSIS — I10 Essential (primary) hypertension: Secondary | ICD-10-CM | POA: Diagnosis not present

## 2015-11-28 DIAGNOSIS — F329 Major depressive disorder, single episode, unspecified: Secondary | ICD-10-CM | POA: Diagnosis not present

## 2015-11-28 DIAGNOSIS — F17200 Nicotine dependence, unspecified, uncomplicated: Secondary | ICD-10-CM | POA: Insufficient documentation

## 2015-11-28 DIAGNOSIS — E119 Type 2 diabetes mellitus without complications: Secondary | ICD-10-CM | POA: Insufficient documentation

## 2015-11-28 DIAGNOSIS — Z79899 Other long term (current) drug therapy: Secondary | ICD-10-CM | POA: Insufficient documentation

## 2015-11-28 DIAGNOSIS — M791 Myalgia: Secondary | ICD-10-CM | POA: Diagnosis present

## 2015-11-28 DIAGNOSIS — E039 Hypothyroidism, unspecified: Secondary | ICD-10-CM | POA: Diagnosis not present

## 2015-11-28 HISTORY — DX: Major depressive disorder, single episode, unspecified: F32.9

## 2015-11-28 HISTORY — DX: Depression, unspecified: F32.A

## 2015-11-28 HISTORY — DX: Disorder of thyroid, unspecified: E07.9

## 2015-11-28 LAB — CBC
HCT: 37 % (ref 35.0–47.0)
Hemoglobin: 12.2 g/dL (ref 12.0–16.0)
MCH: 24.9 pg — ABNORMAL LOW (ref 26.0–34.0)
MCHC: 32.9 g/dL (ref 32.0–36.0)
MCV: 75.8 fL — AB (ref 80.0–100.0)
PLATELETS: 259 10*3/uL (ref 150–440)
RBC: 4.88 MIL/uL (ref 3.80–5.20)
RDW: 18.7 % — AB (ref 11.5–14.5)
WBC: 9.5 10*3/uL (ref 3.6–11.0)

## 2015-11-28 LAB — BASIC METABOLIC PANEL
Anion gap: 5 (ref 5–15)
BUN: 10 mg/dL (ref 6–20)
CALCIUM: 8.6 mg/dL — AB (ref 8.9–10.3)
CO2: 30 mmol/L (ref 22–32)
CREATININE: 1.1 mg/dL — AB (ref 0.44–1.00)
Chloride: 101 mmol/L (ref 101–111)
GFR calc Af Amer: >60 mL/min (ref >60)
Glucose, Bld: 91 mg/dL (ref 65–99)
Potassium: 3.6 mmol/L (ref 3.5–5.1)
SODIUM: 136 mmol/L (ref 135–145)

## 2015-11-28 LAB — TSH: TSH: 57.393 u[IU]/mL — AB (ref 0.350–4.500)

## 2015-11-28 LAB — TROPONIN I

## 2015-11-28 MED ORDER — LEVOTHYROXINE SODIUM 200 MCG PO TABS: 200.0000 ug | ORAL_TABLET | Freq: Every day | ORAL | Status: DC

## 2015-11-28 MED ORDER — LEVOTHYROXINE SODIUM 200 MCG PO TABS
200.0000 ug | ORAL_TABLET | Freq: Every day | ORAL | Status: DC
  Administered 2015-11-28: 200 ug via ORAL
  Filled 2015-11-28: qty 1

## 2015-11-28 MED ORDER — LEVOTHYROXINE SODIUM 100 MCG PO TABS
100.0000 ug | ORAL_TABLET | Freq: Every day | ORAL | Status: DC
  Filled 2015-11-28: qty 1

## 2015-11-28 MED ORDER — LEVOTHYROXINE SODIUM 100 MCG PO TABS: 100.0000 ug | ORAL_TABLET | Freq: Every day | ORAL | Status: DC

## 2015-11-28 NOTE — ED Notes (Signed)
Pharmacy called 2nd time

## 2015-11-28 NOTE — ED Notes (Addendum)
Pt reports muscle cramps in right arm, left leg and throat. Pt alert and oriented in triage, no distress noted, even and non labored respirations noted at this time. Pt also reports chest pressure x3 days, reports she uses a CPAP machine. Pt reports she's been off of her thyroid medication for two months due to loss of income.

## 2015-11-28 NOTE — Discharge Instructions (Signed)
Please seek medical attention for any high fevers, chest pain, shortness of breath, change in behavior, persistent vomiting, bloody stool or any other new or concerning symptoms.   Hypothyroidism Hypothyroidism is a disorder of the thyroid. The thyroid is a large gland that is located in the lower front of the neck. The thyroid releases hormones that control how the body works. With hypothyroidism, the thyroid does not make enough of these hormones. CAUSES Causes of hypothyroidism may include:  Viral infections.  Pregnancy.  Your own defense system (immune system) attacking your thyroid.  Certain medicines.  Birth defects.  Past radiation treatments to your head or neck.  Past treatment with radioactive iodine.  Past surgical removal of part or all of your thyroid.  Problems with the gland that is located in the center of your brain (pituitary). SIGNS AND SYMPTOMS Signs and symptoms of hypothyroidism may include:  Feeling as though you have no energy (lethargy).  Inability to tolerate cold.  Weight gain that is not explained by a change in diet or exercise habits.  Dry skin.  Coarse hair.  Menstrual irregularity.  Slowing of thought processes.  Constipation.  Sadness or depression. DIAGNOSIS  Your health care provider may diagnose hypothyroidism with blood tests and ultrasound tests. TREATMENT Hypothyroidism is treated with medicine that replaces the hormones that your body does not make. After you begin treatment, it may take several weeks for symptoms to go away. HOME CARE INSTRUCTIONS   Take medicines only as directed by your health care provider.  If you start taking any new medicines, tell your health care provider.  Keep all follow-up visits as directed by your health care provider. This is important. As your condition improves, your dosage needs may change. You will need to have blood tests regularly so that your health care provider can watch your  condition. SEEK MEDICAL CARE IF:  Your symptoms do not get better with treatment.  You are taking thyroid replacement medicine and:  You sweat excessively.  You have tremors.  You feel anxious.  You lose weight rapidly.  You cannot tolerate heat.  You have emotional swings.  You have diarrhea.  You feel weak. SEEK IMMEDIATE MEDICAL CARE IF:   You develop chest pain.  You develop an irregular heartbeat.  You develop a rapid heartbeat.   This information is not intended to replace advice given to you by your health care provider. Make sure you discuss any questions you have with your health care provider.   Document Released: 07/02/2005 Document Revised: 07/23/2014 Document Reviewed: 11/17/2013 Elsevier Interactive Patient Education [employer]

## 2015-11-28 NOTE — ED Provider Notes (Signed)
[facility] Emergency Department Provider Note    ____________________________________________  Time seen: ~1800  I have reviewed the triage vital signs and the nursing notes.   HISTORY  Chief Complaint Muscle Pain   History limited by: Not Limited   HPI Sierra Pineda is a 40 y.o. female with history of hypothyroidism who presents to the emergency department today because of concerns for muscle cramps. She states that it is primarily in her right arm and left leg and back. She states they have been getting worse over the past couple of weeks.She states that she has also felt fatigued over this time. She denies any fevers. Denies any nausea vomiting diarrhea. Denies any chest pain. She does state that she has not been on her thyroid medication for the past 2 weeks because she is unable to afford it.   Past Medical History  Diagnosis Date  . Neck mass 2014  . Diabetes mellitus without complication (HCC)   . Hypertension   . Thyroid disease   . Depression     Patient Active Problem List   Diagnosis Date Noted  . Abdominal pain, right upper quadrant 12/30/2012    Past Surgical History  Procedure Laterality Date  . Neck mass excision  2014    Dr Willeen CassBennett  . Cholecystectomy  2014    Dr Cecelia ByarsHashmi    Current Outpatient Rx  Name  Route  Sig  Dispense  Refill  . levothyroxine (SYNTHROID, LEVOTHROID) 100 MCG tablet   Oral   Take 100 mcg by mouth daily before breakfast.         . meclizine (ANTIVERT) 25 MG tablet   Oral   Take 1 tablet (25 mg total) by mouth 3 (three) times daily as needed for dizziness.   20 tablet   0     Allergies Other and Tylenol  No family history on file.  Social History Social History  Substance Use Topics  . Smoking status: Current Every Day Smoker    Last Attempt to Quit: 09/13/2012  . Smokeless tobacco: None  . Alcohol Use: No    Review of Systems  Constitutional: Negative for fever. Cardiovascular:  Negative for chest pain. Respiratory: Negative for shortness of breath. Gastrointestinal: Negative for abdominal pain, vomiting and diarrhea. Neurological: Negative for headaches, focal weakness or numbness.   10-point ROS otherwise negative.  ____________________________________________   PHYSICAL EXAM:  VITAL SIGNS: ED Triage Vitals  Enc Vitals Group     BP 11/28/15 1603 153/94 mmHg     Pulse Rate 11/28/15 1603 86     Resp 11/28/15 1603 24     Temp 11/28/15 1603 98.4 F (36.9 C)     Temp Source 11/28/15 1603 Oral     SpO2 11/28/15 1603 94 %     Weight 11/28/15 1603 370 lb (167.831 kg)     Height 11/28/15 1603 5\' 5"  (1.651 m)     Head Cir --      Peak Flow --      Pain Score 11/28/15 1603 6   Constitutional: Alert and oriented. Well appearing and in no distress. Eyes: Conjunctivae are normal. PERRL. Normal extraocular movements. ENT   Head: Normocephalic and atraumatic.   Nose: No congestion/rhinnorhea.   Mouth/Throat: Mucous membranes are moist.   Neck: No stridor. Hematological/Lymphatic/Immunilogical: No cervical lymphadenopathy. Cardiovascular: Normal rate, regular rhythm.  No murmurs, rubs, or gallops. Respiratory: Normal respiratory effort without tachypnea nor retractions. Breath sounds are clear and equal bilaterally. No wheezes/rales/rhonchi. Gastrointestinal:  Soft and nontender. No distention.  Genitourinary: Deferred Musculoskeletal: Normal range of motion in all extremities. No joint effusions.  No lower extremity tenderness nor edema. Neurologic:  Normal speech and language. No gross focal neurologic deficits are appreciated.  Skin:  Skin is warm, dry and intact. No rash noted. Psychiatric: Mood and affect are normal. Speech and behavior are normal. Patient exhibits appropriate insight and judgment.  ____________________________________________    LABS (pertinent positives/negatives)  Labs Reviewed  BASIC METABOLIC PANEL - Abnormal;  Notable for the following:    Creatinine, Ser 1.10 (*)    Calcium 8.6 (*)    All other components within normal limits  CBC - Abnormal; Notable for the following:    MCV 75.8 (*)    MCH 24.9 (*)    RDW 18.7 (*)    All other components within normal limits  TSH - Abnormal; Notable for the following:    TSH 57.393 (*)    All other components within normal limits  TROPONIN I     ____________________________________________   EKG  I, Phineas Semen, attending physician, personally viewed and interpreted this EKG  EKG Time: 1603 Rate: 86 Rhythm: Normal sinus rhythm Axis: left axis deviation Intervals: qtc 469 QRS: narrow, q waves V1 ST changes: no st elevation Impression: abnormal ekg  ____________________________________________    RADIOLOGY  CXR  IMPRESSION: No acute cardiopulmonary disease. Stable chronic elevation of the right hemidiaphragm and chronic scar/atelectasis in the right middle lobe.   ____________________________________________   PROCEDURES  Procedure(s) performed: None  Critical Care performed: No  ____________________________________________   INITIAL IMPRESSION / ASSESSMENT AND PLAN / ED COURSE  Pertinent labs & imaging results that were available during my care of the patient were reviewed by me and considered in my medical decision making (see chart for details).  Patient presents to the emergency department today with primary concerns for somewhat diffuse muscle cramping. On physical exam she does appear to be slightly uncomfortable with movement however lungs and heart clear. Abdomen is benign. Patient's blood work is significant for severely elevated TSH which is not surprising in the context of hypothyroidism without current treatment. Discussed with patient that likely her symptoms are related to hypothyroidism. Will plan on giving patient dose of levothyroxine here. We will give patient information about medication assistance  program.  ----------------------------------------- 7:13 PM on 11/28/2015 -----------------------------------------  Patient states that her primary care doctor has increased her Synthroid medication to 250 g per day. This point will prescribe 200 mcg per day. Will have patient follow-up with primary care.  ____________________________________________   FINAL CLINICAL IMPRESSION(S) / ED DIAGNOSES  Final diagnoses:  Muscle cramping  Hypothyroidism, unspecified hypothyroidism type     Phineas Semen, MD 11/28/15 1914

## 2016-08-07 ENCOUNTER — Ambulatory Visit: Payer: Medicaid Other | Attending: Otolaryngology

## 2016-08-07 DIAGNOSIS — G4730 Sleep apnea, unspecified: Secondary | ICD-10-CM | POA: Diagnosis present

## 2016-08-07 DIAGNOSIS — G4733 Obstructive sleep apnea (adult) (pediatric): Secondary | ICD-10-CM | POA: Insufficient documentation

## 2016-10-08 ENCOUNTER — Emergency Department: Payer: Medicaid Other

## 2016-10-08 ENCOUNTER — Encounter: Payer: Self-pay | Admitting: *Deleted

## 2016-10-08 DIAGNOSIS — I10 Essential (primary) hypertension: Secondary | ICD-10-CM | POA: Insufficient documentation

## 2016-10-08 DIAGNOSIS — M79604 Pain in right leg: Secondary | ICD-10-CM | POA: Diagnosis not present

## 2016-10-08 DIAGNOSIS — F17210 Nicotine dependence, cigarettes, uncomplicated: Secondary | ICD-10-CM | POA: Diagnosis not present

## 2016-10-08 DIAGNOSIS — M79605 Pain in left leg: Secondary | ICD-10-CM | POA: Insufficient documentation

## 2016-10-08 DIAGNOSIS — E119 Type 2 diabetes mellitus without complications: Secondary | ICD-10-CM | POA: Diagnosis not present

## 2016-10-08 LAB — CBC
HCT: 39.4 % (ref 35.0–47.0)
HEMOGLOBIN: 12.9 g/dL (ref 12.0–16.0)
MCH: 24.9 pg — ABNORMAL LOW (ref 26.0–34.0)
MCHC: 32.6 g/dL (ref 32.0–36.0)
MCV: 76.5 fL — ABNORMAL LOW (ref 80.0–100.0)
Platelets: 267 10*3/uL (ref 150–440)
RBC: 5.16 MIL/uL (ref 3.80–5.20)
RDW: 17.2 % — ABNORMAL HIGH (ref 11.5–14.5)
WBC: 9.2 10*3/uL (ref 3.6–11.0)

## 2016-10-08 LAB — CK: CK TOTAL: 145 U/L (ref 38–234)

## 2016-10-08 LAB — BASIC METABOLIC PANEL
ANION GAP: 7 (ref 5–15)
BUN: 13 mg/dL (ref 6–20)
CALCIUM: 8.8 mg/dL — AB (ref 8.9–10.3)
CO2: 32 mmol/L (ref 22–32)
Chloride: 98 mmol/L — ABNORMAL LOW (ref 101–111)
Creatinine, Ser: 0.97 mg/dL (ref 0.44–1.00)
Glucose, Bld: 165 mg/dL — ABNORMAL HIGH (ref 65–99)
Potassium: 3.7 mmol/L (ref 3.5–5.1)
Sodium: 137 mmol/L (ref 135–145)

## 2016-10-08 NOTE — ED Triage Notes (Signed)
Pt has pain in both lower legs.  Pt states pain behind both knees.  No known injury to legs.  No sob.  No swelling.

## 2016-10-09 ENCOUNTER — Emergency Department
Admission: EM | Admit: 2016-10-09 | Discharge: 2016-10-09 | Disposition: A | Discharge status: Home / Self Care | Payer: Medicaid Other | Attending: Emergency Medicine | Admitting: Emergency Medicine

## 2016-10-09 DIAGNOSIS — M79604 Pain in right leg: Secondary | ICD-10-CM

## 2016-10-09 DIAGNOSIS — M79605 Pain in left leg: Secondary | ICD-10-CM

## 2016-10-09 MED ORDER — CEPHALEXIN 500 MG PO CAPS: 500.0000 mg | ORAL_CAPSULE | Freq: Two times a day (BID) | ORAL | 0 refills | Status: AC

## 2016-10-09 MED ORDER — KETOROLAC TROMETHAMINE 10 MG PO TABS
10.0000 mg | ORAL_TABLET | Freq: Once | ORAL | Status: AC
  Administered 2016-10-09: 10 mg via ORAL
  Filled 2016-10-09: qty 1

## 2016-10-09 MED ORDER — KETOROLAC TROMETHAMINE 10 MG PO TABS: 10.0000 mg | ORAL_TABLET | Freq: Four times a day (QID) | ORAL | 0 refills | Status: DC | PRN

## 2016-10-09 NOTE — ED Notes (Signed)
Pt discharged to home.  Family member driving.  Discharge instructions reviewed.  Verbalized understanding.  No questions or concerns at this time.  Teach back verified.  Pt in NAD.  No items left in ED.   

## 2016-10-09 NOTE — ED Provider Notes (Signed)
[facility] Emergency Department Provider Note   Time seen 12:30 AM  I have reviewed the triage vital signs and the nursing notes.   HISTORY  Chief Complaint Leg Pain   HPI  Sierra Pineda is a 41 y.o. female Willis of chronic medical conditions presents to the emergency department with nontraumatic bilateral calf pain 2 days. Patient denies any chest pain no dyspnea. Patient denies any history of DVT or PE. Patient denies any recent immobility. Patient denies any swelling. Patient states that her current pain score is 8 out of 10 worse with palpation.   Past Medical History:  Diagnosis Date  . Depression   . Diabetes mellitus without complication (HCC)   . Hypertension   . Neck mass 2014  . Thyroid disease     Patient Active Problem List   Diagnosis Date Noted  . Abdominal pain, right upper quadrant 12/30/2012    Past Surgical History:  Procedure Laterality Date  . CHOLECYSTECTOMY  2014   Dr Cecelia Byars  . NECK MASS EXCISION  2014   Dr Willeen Cass    Prior to Admission medications   Medication Sig Start Date End Date Taking? Authorizing Provider  levothyroxine (SYNTHROID) 100 MCG tablet Take 1 tablet (100 mcg total) by mouth daily before breakfast. 11/28/15   Phineas Semen, MD  levothyroxine (SYNTHROID) 200 MCG tablet Take 1 tablet (200 mcg total) by mouth daily. 11/28/15 11/27/16  Phineas Semen, MD  levothyroxine (SYNTHROID, LEVOTHROID) 100 MCG tablet Take 100 mcg by mouth daily before breakfast.    Historical Provider, MD  meclizine (ANTIVERT) 25 MG tablet Take 1 tablet (25 mg total) by mouth 3 (three) times daily as needed for dizziness. 10/10/15   Jene Every, MD    Allergies Other and Tylenol [acetaminophen]  No family history on file.  Social History Social History  Substance Use Topics  . Smoking status: Current Every Day Smoker    Last attempt to quit: 09/13/2012  . Smokeless tobacco: Never Used  . Alcohol use No    Review of  Systems Constitutional: No fever/chills Eyes: No visual changes. ENT: No sore throat. Cardiovascular: Denies chest pain. Respiratory: Denies shortness of breath. Gastrointestinal: No abdominal pain.  No nausea, no vomiting.  No diarrhea.  No constipation. Genitourinary: Negative for dysuria. Musculoskeletal: Negative for back pain. Positive for bilateral lower extremity. Skin: Negative for rash. Neurological: Negative for headaches, focal weakness or numbness.  10-point ROS otherwise negative.  ____________________________________________   PHYSICAL EXAM:  VITAL SIGNS: ED Triage Vitals  Enc Vitals Group     BP 10/08/16 2022 (!) 163/101     Pulse Rate 10/08/16 2022 98     Resp 10/08/16 2022 18     Temp 10/08/16 2022 98.6 F (37 C)     Temp Source 10/08/16 2022 Oral     SpO2 10/08/16 2022 93 %     Weight 10/08/16 2022 (!) 370 lb (167.8 kg)     Height 10/08/16 2022 5\' 6"  (1.676 m)     Head Circumference --      Peak Flow --      Pain Score 10/08/16 2036 8     Pain Loc --      Pain Edu? --      Excl. in GC? --     Constitutional: Alert and oriented. Well appearing and in no acute distress. Eyes: Conjunctivae are normal. PERRL. EOMI. Head: Atraumatic. Mouth/Throat: Mucous membranes are moist.  Oropharynx non-erythematous. Neck: No stridor.  Cardiovascular: Normal rate, regular rhythm. Good peripheral circulation. Grossly normal heart sounds.Palpable equal PT and DP pulses bilateral extremity  Respiratory: Normal respiratory effort.  No retractions. Lungs CTAB. Gastrointestinal: Soft and nontender. No distention.  Musculoskeletal: Pain with very gentle palpation bilateral calf extending to heal  Neurologic:  Normal speech and language. No gross focal neurologic deficits are appreciated.  Skin:  Skin is warm, dry and intact. No rash noted. Psychiatric: Mood and affect are normal. Speech and behavior are normal.  ____________________________________________   LABS (all  labs ordered are listed, but only abnormal results are displayed)  Labs Reviewed  BASIC METABOLIC PANEL - Abnormal; Notable for the following:       Result Value   Chloride 98 (*)    Glucose, Bld 165 (*)    Calcium 8.8 (*)    All other components within normal limits  CBC - Abnormal; Notable for the following:    MCV 76.5 (*)    MCH 24.9 (*)    RDW 17.2 (*)    All other components within normal limits  CK   __  RADIOLOGY I, [place] N BROWN, personally viewed and evaluated these images (plain radiographs) as part of my medical decision making, as well as reviewing the written report by the radiologist.  [place] Venous Img Lower Bilateral  Result Date: 10/08/2016 CLINICAL DATA:  Bilateral lower extremity pain, swelling, and erythema for 1.5 weeks. Morbid obesity. EXAM: BILATERAL LOWER EXTREMITY VENOUS DOPPLER ULTRASOUND TECHNIQUE: Gray-scale sonography with graded compression, as well as color Doppler and duplex ultrasound were performed to evaluate the lower extremity deep venous systems from the level of the common femoral vein and including the common femoral, femoral, profunda femoral, popliteal and calf veins including the posterior tibial, peroneal and gastrocnemius veins when visible. The superficial great saphenous vein was also interrogated. Spectral Doppler was utilized to evaluate flow at rest and with distal augmentation maneuvers in the common femoral, femoral and popliteal veins. COMPARISON:  None. FINDINGS: Technically difficult exam due to morbid obesity. RIGHT LOWER EXTREMITY Common Femoral Vein: No evidence of thrombus. Normal compressibility, respiratory phasicity and response to augmentation. Saphenofemoral Junction: No evidence of thrombus. Normal compressibility and flow on color Doppler imaging. Profunda Femoral Vein: No evidence of thrombus. Normal compressibility and flow on color Doppler imaging. Femoral Vein: No evidence of thrombus. Normal compressibility, respiratory  phasicity and response to augmentation. Popliteal Vein: No evidence of thrombus. Normal compressibility, respiratory phasicity and response to augmentation. Calf Veins: No evidence of thrombus. Normal compressibility and flow on color Doppler imaging. Superficial Great Saphenous Vein: No evidence of thrombus. Normal compressibility and flow on color Doppler imaging. Venous Reflux:  None. Other Findings:  None. LEFT LOWER EXTREMITY Common Femoral Vein: No evidence of thrombus. Normal compressibility, respiratory phasicity and response to augmentation. Saphenofemoral Junction: No evidence of thrombus. Normal compressibility and flow on color Doppler imaging. Profunda Femoral Vein: No evidence of thrombus. Normal compressibility and flow on color Doppler imaging. Femoral Vein: No evidence of thrombus. Normal compressibility, respiratory phasicity and response to augmentation. Popliteal Vein: No evidence of thrombus. Normal compressibility, respiratory phasicity and response to augmentation. Calf Veins: No evidence of thrombus. Normal compressibility and flow on color Doppler imaging. Superficial Great Saphenous Vein: No evidence of thrombus. Normal compressibility and flow on color Doppler imaging. Venous Reflux:  None. Other Findings:  None. IMPRESSION: No evidence of deep venous thrombosis. Electronically Signed   By: Myles RosenthalJohn  Stahl M.D.   On: 10/08/2016 21:46     Procedures  ____________________________________________   INITIAL IMPRESSION / ASSESSMENT AND PLAN / ED COURSE  Pertinent labs & imaging results that were available during my care of the patient were reviewed by me and considered in my medical decision making (see chart for details).  No clear etiology for the patient's bilateral extremity pain identified patient referred to Dr. Hessie Diener primary care provider for outpatient evaluation      ____________________________________________  FINAL CLINICAL IMPRESSION(S) / ED DIAGNOSES  Final  diagnoses:  Bilateral leg pain     MEDICATIONS GIVEN DURING THIS VISIT:  Medications - No data to display   NEW OUTPATIENT MEDICATIONS STARTED DURING THIS VISIT:  New Prescriptions   No medications on file    Modified Medications   No medications on file    Discontinued Medications   No medications on file     Note:  This document was prepared using Dragon voice recognition software and may include unintentional dictation errors.    Darci Current, MD 10/09/16 [phone]

## 2016-10-26 ENCOUNTER — Encounter: Payer: Self-pay | Admitting: *Deleted

## 2016-10-29 ENCOUNTER — Encounter: Payer: Self-pay | Admitting: Anesthesiology

## 2016-10-29 ENCOUNTER — Encounter: Admission: RE | Payer: Self-pay | Source: Ambulatory Visit

## 2016-10-29 SURGERY — COLONOSCOPY WITH PROPOFOL
Anesthesia: General

## 2016-10-29 NOTE — Anesthesia Preprocedure Evaluation (Deleted)
Anesthesia Evaluation  Patient identified by MRN, date of birth, ID band Patient awake    Reviewed: Allergy & Precautions, H&P , NPO status , Patient's Chart, lab work & pertinent test results, reviewed documented beta blocker date and time   Airway Mallampati: II   Neck ROM: full    Dental  (+) Poor Dentition   Pulmonary neg pulmonary ROS, Current Smoker,    Pulmonary exam normal        Cardiovascular hypertension, negative cardio ROS Normal cardiovascular exam Rhythm:regular Rate:Normal     Neuro/Psych negative neurological ROS  negative psych ROS   GI/Hepatic negative GI ROS, Neg liver ROS,   Endo/Other  negative endocrine ROSdiabetesHypothyroidism Morbid obesity  Renal/GU negative Renal ROS  negative genitourinary   Musculoskeletal   Abdominal   Peds  Hematology negative hematology ROS (+)   Anesthesia Other Findings Past Medical History: No date: Depression No date: Diabetes mellitus without complication (HCC) No date: Hypertension No date: Hypothyroidism 2014: Neck mass No date: Thyroid disease Past Surgical History: No date: CESAREAN SECTION 2014: CHOLECYSTECTOMY     Comment: Dr Cecelia Byars 2014: NECK MASS EXCISION     Comment: Dr Willeen Cass No date: TOTAL THYROIDECTOMY   Reproductive/Obstetrics negative OB ROS                             Anesthesia Physical Anesthesia Plan  ASA: IV  Anesthesia Plan: General   Post-op Pain Management:    Induction:   Airway Management Planned:   Additional Equipment:   Intra-op Plan:   Post-operative Plan:   Informed Consent: I have reviewed the patients History and Physical, chart, labs and discussed the procedure including the risks, benefits and alternatives for the proposed anesthesia with the patient or authorized representative who has indicated his/her understanding and acceptance.   Dental Advisory Given  Plan Discussed  with: CRNA  Anesthesia Plan Comments:         Anesthesia Quick Evaluation

## 2016-10-30 ENCOUNTER — Ambulatory Visit
Admission: RE | Admit: 2016-10-30 | Payer: Medicaid Other | Source: Ambulatory Visit | Admitting: Unknown Physician Specialty

## 2016-10-30 HISTORY — DX: Hypothyroidism, unspecified: E03.9

## 2017-01-09 ENCOUNTER — Ambulatory Visit: Payer: Medicaid Other | Admitting: Anesthesiology

## 2017-01-09 ENCOUNTER — Ambulatory Visit
Admission: RE | Admit: 2017-01-09 | Discharge: 2017-01-09 | Disposition: A | Discharge status: Home / Self Care | Payer: Medicaid Other | Source: Ambulatory Visit | Attending: Unknown Physician Specialty | Admitting: Unknown Physician Specialty

## 2017-01-09 ENCOUNTER — Encounter: Payer: Self-pay | Admitting: *Deleted

## 2017-01-09 ENCOUNTER — Encounter
Admission: RE | Disposition: A | Discharge status: Home / Self Care | Payer: Self-pay | Source: Ambulatory Visit | Attending: Unknown Physician Specialty

## 2017-01-09 DIAGNOSIS — Z886 Allergy status to analgesic agent status: Secondary | ICD-10-CM | POA: Diagnosis not present

## 2017-01-09 DIAGNOSIS — Z91018 Allergy to other foods: Secondary | ICD-10-CM | POA: Diagnosis not present

## 2017-01-09 DIAGNOSIS — K625 Hemorrhage of anus and rectum: Secondary | ICD-10-CM | POA: Insufficient documentation

## 2017-01-09 DIAGNOSIS — K2970 Gastritis, unspecified, without bleeding: Secondary | ICD-10-CM | POA: Insufficient documentation

## 2017-01-09 DIAGNOSIS — K640 First degree hemorrhoids: Secondary | ICD-10-CM | POA: Insufficient documentation

## 2017-01-09 DIAGNOSIS — Z87891 Personal history of nicotine dependence: Secondary | ICD-10-CM | POA: Diagnosis not present

## 2017-01-09 DIAGNOSIS — E039 Hypothyroidism, unspecified: Secondary | ICD-10-CM | POA: Insufficient documentation

## 2017-01-09 DIAGNOSIS — I10 Essential (primary) hypertension: Secondary | ICD-10-CM | POA: Diagnosis not present

## 2017-01-09 DIAGNOSIS — R1013 Epigastric pain: Secondary | ICD-10-CM | POA: Diagnosis present

## 2017-01-09 DIAGNOSIS — Z79899 Other long term (current) drug therapy: Secondary | ICD-10-CM | POA: Insufficient documentation

## 2017-01-09 HISTORY — DX: Gastro-esophageal reflux disease without esophagitis: K21.9

## 2017-01-09 HISTORY — DX: Dyspnea, unspecified: R06.00

## 2017-01-09 HISTORY — DX: Sleep apnea, unspecified: G47.30

## 2017-01-09 HISTORY — DX: Anemia, unspecified: D64.9

## 2017-01-09 HISTORY — PX: ESOPHAGOGASTRODUODENOSCOPY (EGD) WITH PROPOFOL: SHX5813

## 2017-01-09 HISTORY — PX: COLONOSCOPY WITH PROPOFOL: SHX5780

## 2017-01-09 SURGERY — COLONOSCOPY WITH PROPOFOL
Anesthesia: General

## 2017-01-09 MED ORDER — MIDAZOLAM HCL 2 MG/2ML IJ SOLN
INTRAMUSCULAR | Status: DC | PRN
  Administered 2017-01-09: 2 mg via INTRAVENOUS

## 2017-01-09 MED ORDER — LABETALOL HCL 5 MG/ML IV SOLN
INTRAVENOUS | Status: DC | PRN
  Administered 2017-01-09 (×3): 5 mg via INTRAVENOUS

## 2017-01-09 MED ORDER — SODIUM CHLORIDE 0.9 % IV SOLN
INTRAVENOUS | Status: DC
  Administered 2017-01-09: 1000 mL via INTRAVENOUS

## 2017-01-09 MED ORDER — SODIUM CHLORIDE 0.9 % IV SOLN: INTRAVENOUS | Status: DC

## 2017-01-09 MED ORDER — GLYCOPYRROLATE 0.2 MG/ML IJ SOLN
INTRAMUSCULAR | Status: AC
Start: 2017-01-09 — End: 2017-01-09
  Filled 2017-01-09: qty 1

## 2017-01-09 MED ORDER — MIDAZOLAM HCL 2 MG/2ML IJ SOLN
INTRAMUSCULAR | Status: AC
  Filled 2017-01-09: qty 2

## 2017-01-09 MED ORDER — FENTANYL CITRATE (PF) 100 MCG/2ML IJ SOLN
INTRAMUSCULAR | Status: AC
  Filled 2017-01-09: qty 2

## 2017-01-09 MED ORDER — LIDOCAINE HCL (PF) 2 % IJ SOLN
INTRAMUSCULAR | Status: AC
  Filled 2017-01-09: qty 2

## 2017-01-09 MED ORDER — GLYCOPYRROLATE 0.2 MG/ML IJ SOLN
INTRAMUSCULAR | Status: DC | PRN
  Administered 2017-01-09: 0.1 mg via INTRAVENOUS

## 2017-01-09 MED ORDER — LIDOCAINE HCL (CARDIAC) 20 MG/ML IV SOLN
INTRAVENOUS | Status: DC | PRN
  Administered 2017-01-09: 30 mg via INTRAVENOUS

## 2017-01-09 MED ORDER — FENTANYL CITRATE (PF) 100 MCG/2ML IJ SOLN
INTRAMUSCULAR | Status: DC | PRN
  Administered 2017-01-09 (×2): 50 ug via INTRAVENOUS

## 2017-01-09 MED ORDER — PROPOFOL 500 MG/50ML IV EMUL
INTRAVENOUS | Status: AC
  Filled 2017-01-09: qty 50

## 2017-01-09 MED ORDER — PROPOFOL 500 MG/50ML IV EMUL
INTRAVENOUS | Status: DC | PRN
  Administered 2017-01-09: 120 ug/kg/min via INTRAVENOUS

## 2017-01-09 NOTE — Anesthesia Procedure Notes (Signed)
Performed by: Tonia GhentOOK-MARTIN, Ethelean Colla Pre-anesthesia Checklist: Patient identified, Emergency Drugs available, Suction available, Patient being monitored and Timeout performed Patient Re-evaluated:Patient Re-evaluated prior to inductionOxygen Delivery Method: Non-rebreather mask Preoxygenation: Pre-oxygenation with 100% oxygen Intubation Type: IV induction Ventilation: Nasal airway inserted- appropriate to patient size Airway Equipment and Method: Bite block Placement Confirmation: positive ETCO2 and CO2 detector

## 2017-01-09 NOTE — Transfer of Care (Signed)
Immediate Anesthesia Transfer of Care Note  Patient: Sierra Pineda  Procedure(s) Performed: Procedure(s): COLONOSCOPY WITH PROPOFOL (N/A) ESOPHAGOGASTRODUODENOSCOPY (EGD) WITH PROPOFOL (N/A)  Patient Location: PACU  Anesthesia Type:General  Level of Consciousness: awake, alert  and sedated  Airway & Oxygen Therapy: Patient Spontanous Breathing and Patient connected to face mask oxygen  Post-op Assessment: Report given to RN and Post -op Vital signs reviewed and stable  Post vital signs: Reviewed and stable  Last Vitals:  Vitals:   01/09/17 1324  BP: (!) 198/100  Pulse: 94  Resp: 20  Temp: 36.1 C    Last Pain:  Vitals:   01/09/17 1324  TempSrc: Tympanic         Complications: No apparent anesthesia complications

## 2017-01-09 NOTE — Op Note (Signed)
[facility] Gastroenterology Patient Name: Sierra Pineda Procedure Date: 01/09/2017 1:36 PM MRN: [id number] Account #: [id number] Date of Birth: 30-Aug-1975 Admit Type: Outpatient Age: 41 Room: [facility] ENDO ROOM 3 Gender: Female Note Status: Finalized Procedure:            Colonoscopy Indications:          Rectal bleeding Providers:            Scot Jun, MD Medicines:            Propofol per Anesthesia Complications:        No immediate complications. Procedure:            Pre-Anesthesia Assessment:                       - After reviewing the risks and benefits, the patient                        was deemed in satisfactory condition to undergo the                        procedure.                       After obtaining informed consent, the colonoscope was                        passed under direct vision. Throughout the procedure,                        the patient's blood pressure, pulse, and oxygen                        saturations were monitored continuously. The                        Colonoscope was introduced through the anus and                        advanced to the the cecum, identified by its                        appearance. The colonoscopy was somewhat difficult due                        to the patient's body habitus. The patient tolerated                        the procedure well. The quality of the bowel                        preparation was adequate to identify polyps 6 mm and                        larger in size. Findings:      Internal hemorrhoids were found during endoscopy. The hemorrhoids were       small, medium-sized and Grade I (internal hemorrhoids that do not       prolapse). No polyps, no inflammation, no diverticulosis, there was a       film of stool over much of the colon and this was washed off as  we       slowly removed the scope.      The exam was otherwise without abnormality. Impression:           - Internal  hemorrhoids.                       - The examination was otherwise normal.                       - No specimens collected. Recommendation:       - Repeat colonoscopy in 10 years for screening                        purposes. Use Anusol or Prep H suppositories to help                        with the inflammation. Scot Junobert T Khloee Garza, MD 01/09/2017 2:24:55 PM This report has been signed electronically. Number of Addenda: 0 Note Initiated On: 01/09/2017 1:36 PM Scope Withdrawal Time: 0 hours 8 minutes 43 seconds  Total Procedure Duration: 0 hours 13 minutes 1 second       [facility]

## 2017-01-09 NOTE — Anesthesia Preprocedure Evaluation (Signed)
Anesthesia Evaluation  Patient identified by MRN, date of birth, ID band Patient awake    Reviewed: Allergy & Precautions, NPO status , Patient's Chart, lab work & pertinent test results  History of Anesthesia Complications Negative for: history of anesthetic complications  Airway Mallampati: III  TM Distance: >3 FB Neck ROM: Full    Dental no notable dental hx.    Pulmonary sleep apnea and Continuous Positive Airway Pressure Ventilation , neg COPD, former smoker,    breath sounds clear to auscultation- rhonchi (-) wheezing      Cardiovascular hypertension, Pt. on medications (-) CAD, (-) Past MI and (-) Cardiac Stents  Rhythm:Regular Rate:Normal - Systolic murmurs and - Diastolic murmurs    Neuro/Psych PSYCHIATRIC DISORDERS Depression negative neurological ROS     GI/Hepatic Neg liver ROS, GERD  ,  Endo/Other  diabetes (diet controlled)Hypothyroidism Morbid obesity  Renal/GU negative Renal ROS     Musculoskeletal negative musculoskeletal ROS (+)   Abdominal (+) + obese,   Peds  Hematology  (+) anemia ,   Anesthesia Other Findings Past Medical History: No date: Anemia No date: Depression No date: Diabetes mellitus without complication (HCC) No date: Dyspnea No date: GERD (gastroesophageal reflux disease) No date: Hypertension No date: Hypothyroidism 2014: Neck mass No date: Sleep apnea No date: Thyroid disease   Reproductive/Obstetrics                             Anesthesia Physical Anesthesia Plan  ASA: III  Anesthesia Plan: General   Post-op Pain Management:    Induction: Intravenous  PONV Risk Score and Plan: 2 and Propofol  Airway Management Planned: Natural Airway  Additional Equipment:   Intra-op Plan:   Post-operative Plan:   Informed Consent: I have reviewed the patients History and Physical, chart, labs and discussed the procedure including the risks,  benefits and alternatives for the proposed anesthesia with the patient or authorized representative who has indicated his/her understanding and acceptance.   Dental advisory given  Plan Discussed with: CRNA and Anesthesiologist  Anesthesia Plan Comments:         Anesthesia Quick Evaluation

## 2017-01-09 NOTE — H&P (Signed)
   Primary Care Physician:  Oswaldo ConroyBender, Abby Daneele, MD Primary Gastroenterologist:  Dr. Mechele CollinElliott  Pre-Procedure History & Physical: HPI:  Sierra Pineda is a 41 y.o. female is here for an endoscopy and colonoscopy.   Past Medical History:  Diagnosis Date  . Anemia   . Depression   . Diabetes mellitus without complication (HCC)   . Dyspnea   . GERD (gastroesophageal reflux disease)   . Hypertension   . Hypothyroidism   . Neck mass 2014  . Sleep apnea   . Thyroid disease     Past Surgical History:  Procedure Laterality Date  . CESAREAN SECTION    . CHOLECYSTECTOMY  2014   Dr Cecelia ByarsHashmi  . NECK MASS EXCISION  2014   Dr Willeen CassBennett  . TONSILLECTOMY    . TOTAL THYROIDECTOMY      Prior to Admission medications   Medication Sig Start Date End Date Taking? Authorizing Provider  levothyroxine (SYNTHROID, LEVOTHROID) 75 MCG tablet Take 75 mcg by mouth daily before breakfast.   Yes [provider]  amLODipine (NORVASC) 5 MG tablet Take 5 mg by mouth daily.    [provider]  fluticasone (FLONASE) 50 MCG/ACT nasal spray Place into both nostrils daily.    [provider]  furosemide (LASIX) 20 MG tablet Take 20 mg by mouth.    [provider]    Allergies as of 01/08/2017 - Review Complete 01/08/2017  Allergen Reaction Noted  . Chocolate flavor Hives 01/08/2017  . Other  12/30/2012  . Tylenol [acetaminophen] Rash and Other (See Comments) 12/30/2012    History reviewed. No pertinent family history.  Social History   Social History  . Marital status: Single    Spouse name: N/A  . Number of children: N/A  . Years of education: N/A   Occupational History  . Not on file.   Social History Main Topics  . Smoking status: Former Smoker    Types: Cigarettes    Quit date: 08/16/2016  . Smokeless tobacco: Never Used  . Alcohol use No  . Drug use: No  . Sexual activity: Not on file   Other Topics Concern  . Not on file   Social History Narrative   . No narrative on file    Review of Systems: See HPI, otherwise negative ROS  Physical Exam: BP (!) 198/100   Pulse 94   Temp 97 F (36.1 C) (Tympanic)   Resp 20   Ht 5\' 5"  (1.651 m)   Wt (!) 167.8 kg (370 lb)   SpO2 97%   BMI 61.57 kg/m  General:   Alert,  pleasant and cooperative in NAD Head:  Normocephalic and atraumatic. Neck:  Supple; no masses or thyromegaly. Lungs:  Clear throughout to auscultation.    Heart:  Regular rate and rhythm. Abdomen:  Soft, nontender and nondistended. Normal bowel sounds, without guarding, and without rebound.   Neurologic:  Alert and  oriented x4;  grossly normal neurologically.  Impression/Plan: Sierra Pineda is here for an endoscopy and colonoscopy to be performed for rectal bleeding and epigastric pain.  Risks, benefits, limitations, and alternatives regarding  endoscopy and colonoscopy have been reviewed with the patient.  Questions have been answered.  All parties agreeable.   Lynnae PrudeELLIOTT, Domonik Levario, MD  01/09/2017, 1:54 PM

## 2017-01-09 NOTE — Anesthesia Postprocedure Evaluation (Signed)
Anesthesia Post Note  Patient: Pauline Auslicia N Tinkham  Procedure(s) Performed: Procedure(s) (LRB): COLONOSCOPY WITH PROPOFOL (N/A) ESOPHAGOGASTRODUODENOSCOPY (EGD) WITH PROPOFOL (N/A)  Patient location during evaluation: PACU Anesthesia Type: General Level of consciousness: awake and alert and oriented Pain management: pain level controlled Vital Signs Assessment: post-procedure vital signs reviewed and stable Respiratory status: spontaneous breathing Cardiovascular status: blood pressure returned to baseline Anesthetic complications: no     Last Vitals:  Vitals:   01/09/17 1324 01/09/17 1430  BP: (!) 198/100 (!) 143/91  Pulse: 94 98  Resp: 20 11  Temp: 36.1 C 36.1 C    Last Pain:  Vitals:   01/09/17 1430  TempSrc: Tympanic  PainSc: 5                  Fallon Haecker

## 2017-01-09 NOTE — Op Note (Signed)
[facility] Gastroenterology Patient Name: Sierra Pineda Procedure Date: 01/09/2017 1:36 PM MRN: [id number] Account #: [id number] Date of Birth: 10/29/1975 Admit Type: Outpatient Age: [age] [facility] ENDO ROOM 3 Gender: Female Note Status: Finalized Procedure:            Upper GI endoscopy Indications:          Epigastric abdominal pain Providers:            Scot Junobert T. Serjio Deupree, MD Referring MD:         Durel Saltsavid Brenner, MD (Referring MD) Medicines:            Propofol per Anesthesia Complications:        No immediate complications. Procedure:            Pre-Anesthesia Assessment:                       - After reviewing the risks and benefits, the patient                        was deemed in satisfactory condition to undergo the                        procedure.                       After obtaining informed consent, the endoscope was                        passed under direct vision. Throughout the procedure,                        the patient's blood pressure, pulse, and oxygen                        saturations were monitored continuously. The Endoscope                        was introduced through the mouth, and advanced to the                        second part of duodenum. The upper GI endoscopy was                        accomplished without difficulty. The patient tolerated                        the procedure well. Findings:      The examined esophagus was normal.      Diffuse mild inflammation characterized by erythema and granularity was       found in the gastric body. Biopsies were taken with a cold forceps for       histology. Biopsies were taken with a cold forceps for Helicobacter       pylori testing.      Diffuse mildly erythematous mucosa without active bleeding and with no       stigmata of bleeding was found in the duodenal bulb. Impression:           - Normal esophagus.                       - Gastritis. Biopsied.                       -  Normal  examined duodenum. Recommendation:       - Await pathology results.                       - Perform a colonoscopy as previously scheduled. Procedure Code(s):    --- Professional ---                       [phone], Esophagogastroduodenoscopy, flexible, transoral;                        with biopsy, single or multiple Diagnosis Code(s):    --- Professional ---                       K29.70, Gastritis, unspecified, without bleeding                       R10.13, Epigastric pain CPT copyright 2016 [employer]. All rights reserved. The codes documented in this report are preliminary and upon coder review may  be revised to meet current compliance requirements. Scot Jun, MD 01/09/2017 2:06:23 PM This report has been signed electronically. Number of Addenda: 0 Note Initiated On: 01/09/2017 1:36 PM      [facility]

## 2017-01-09 NOTE — Anesthesia Post-op Follow-up Note (Cosign Needed)
Anesthesia QCDR form completed.        

## 2017-01-10 ENCOUNTER — Encounter: Payer: Self-pay | Admitting: Unknown Physician Specialty

## 2017-01-10 NOTE — OR Nursing (Signed)
Robin from office to call pt.

## 2017-01-10 NOTE — OR Nursing (Signed)
Patient called stating her temperature is 102 , but decreasing and the glands bilaterally in the throat area are swollen and tender. I called the office so they could call her and decide if she neededto come in or what treatment she needed.

## 2017-01-11 LAB — SURGICAL PATHOLOGY

## 2017-11-26 IMAGING — US US EXTREM LOW VENOUS BILAT
1 series · 13 of 24 positions shown · non-contrast
Comparison: None.

CLINICAL DATA: Bilateral lower extremity pain, swelling, and
erythema for 1.5 weeks. Morbid obesity.



[Series 1: us extrem low venous bilat · 0.10mm/px · 13 of 59 slices shown]
[im 1/59]
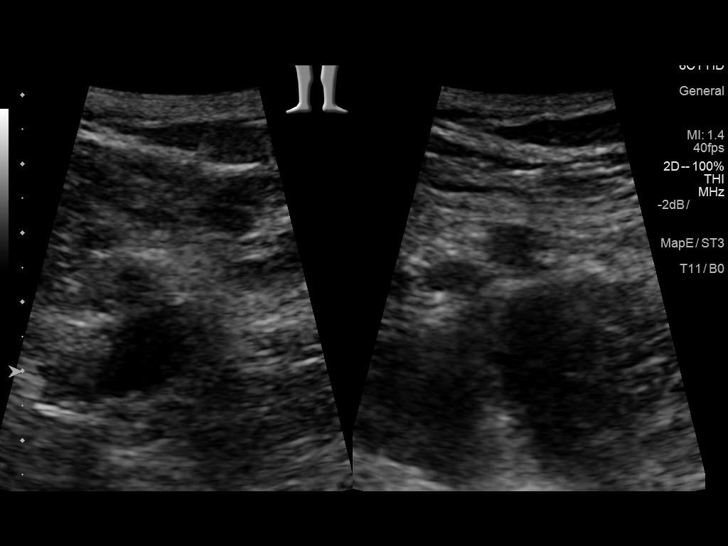
[im 6/59]
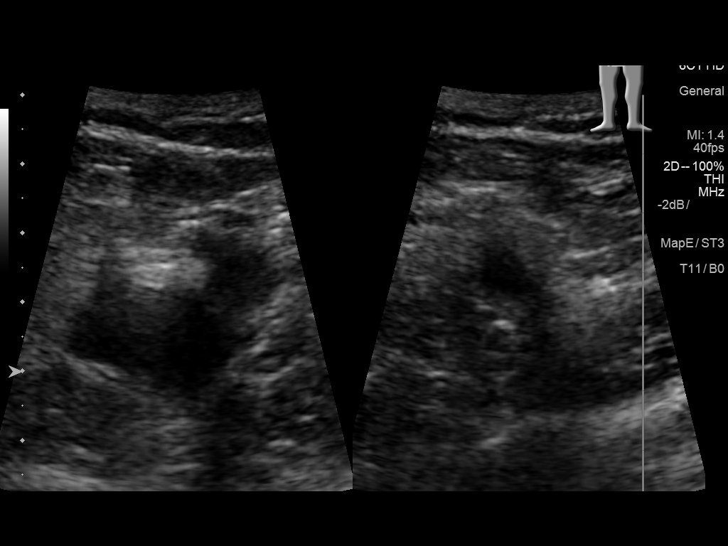
[im 11/59]
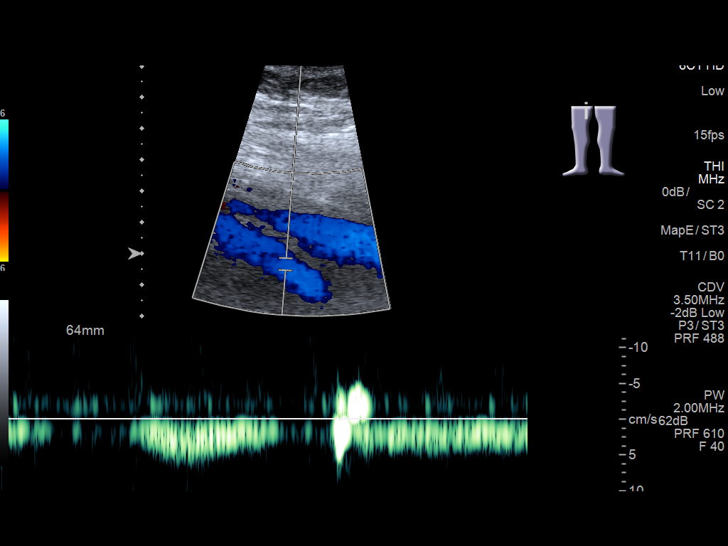
[im 16/59]
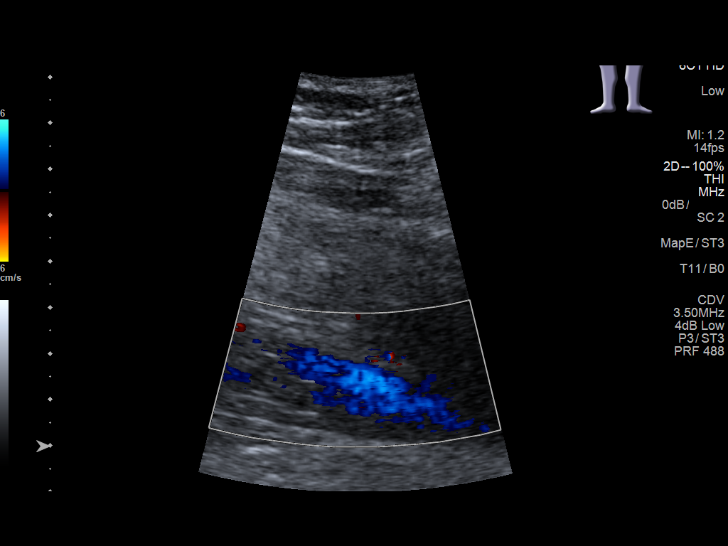
[im 21/59]
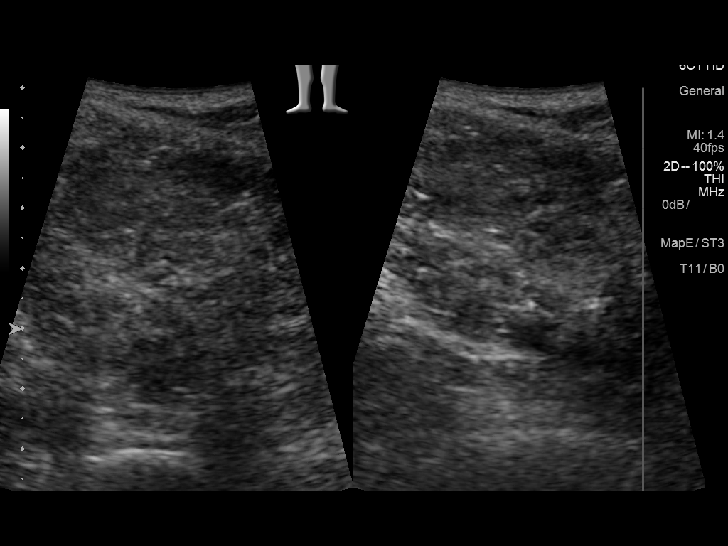
[im 26/59]
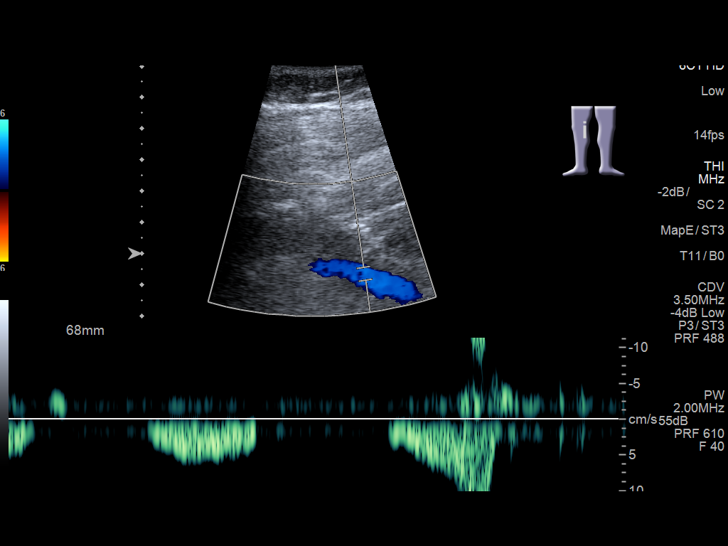
[im 31/59]
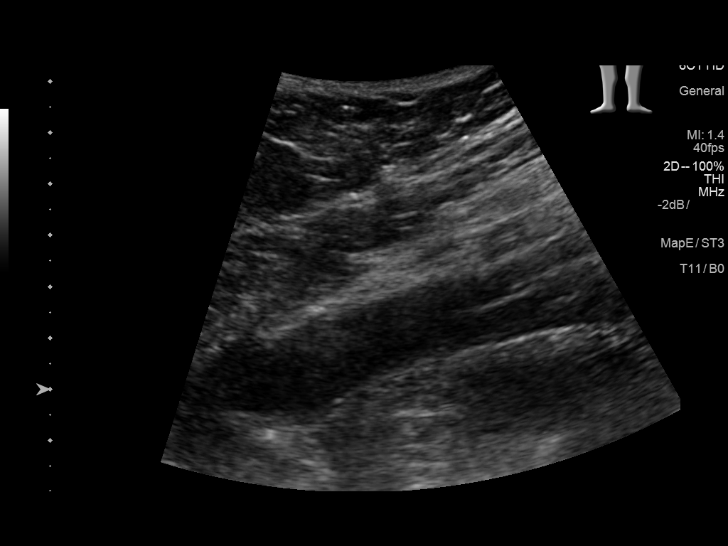
[im 33/59]
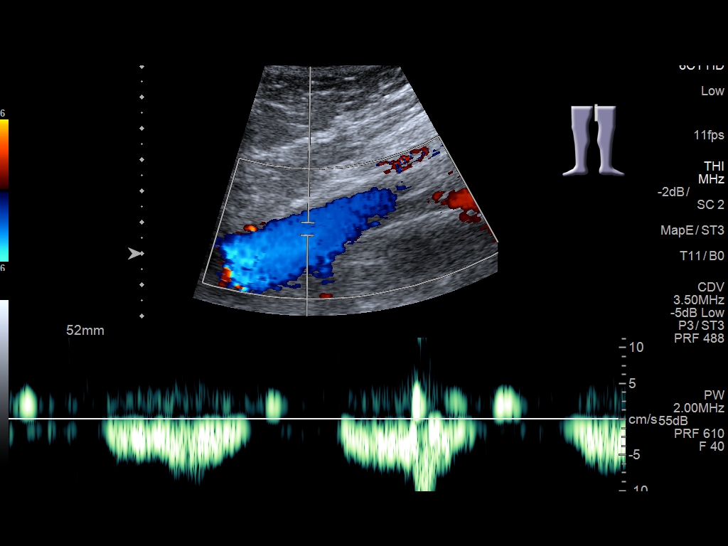
[im 38/59]
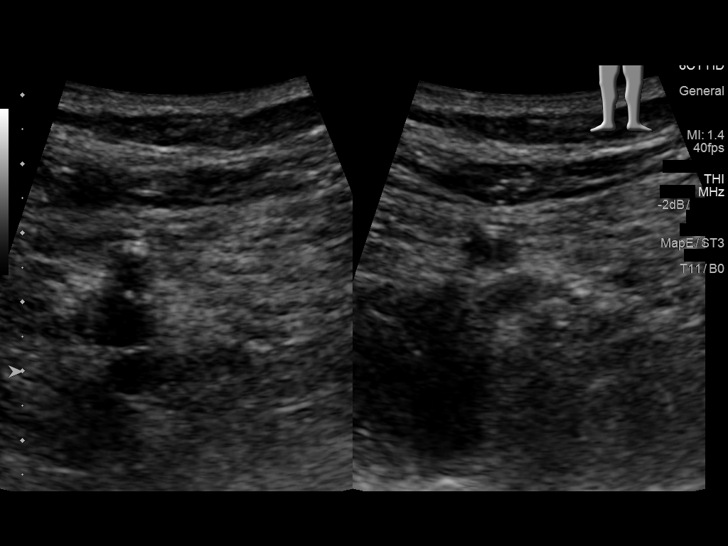
[im 43/59]
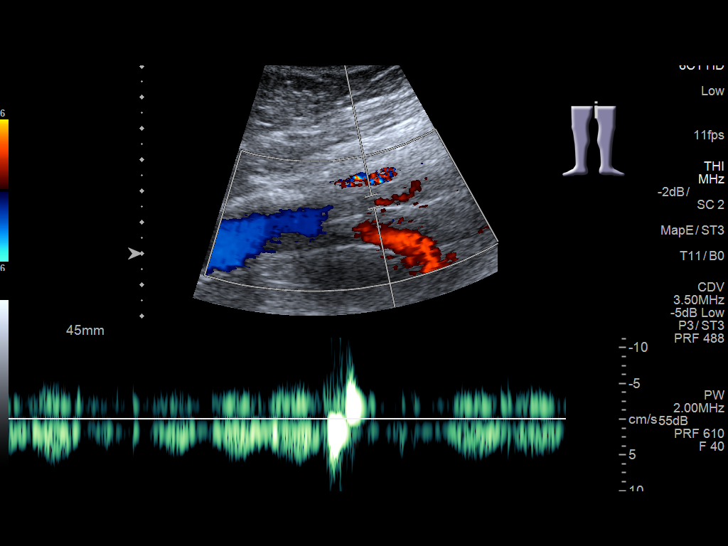
[im 48/59]
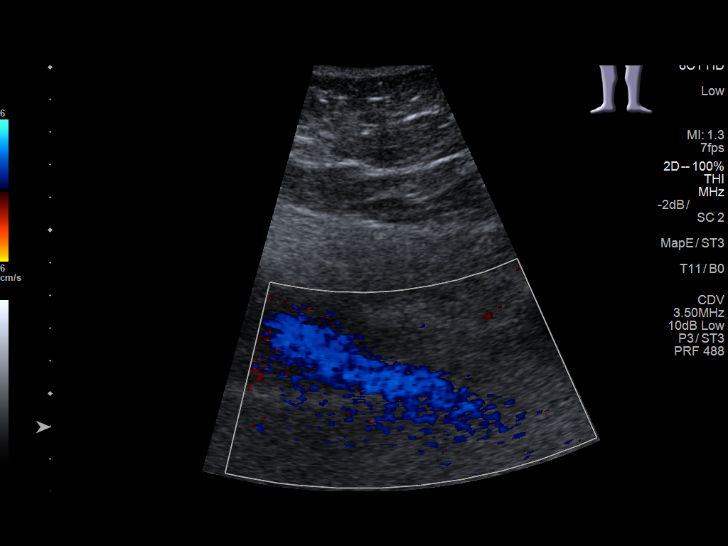
[im 53/59]
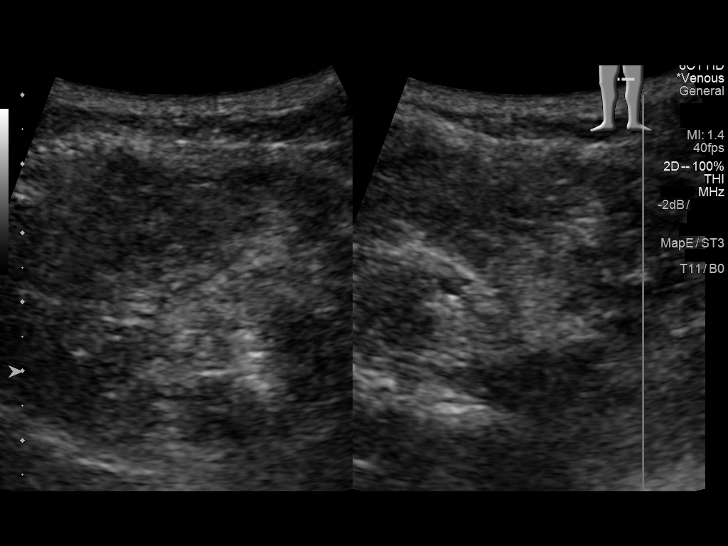
[im 59/59]
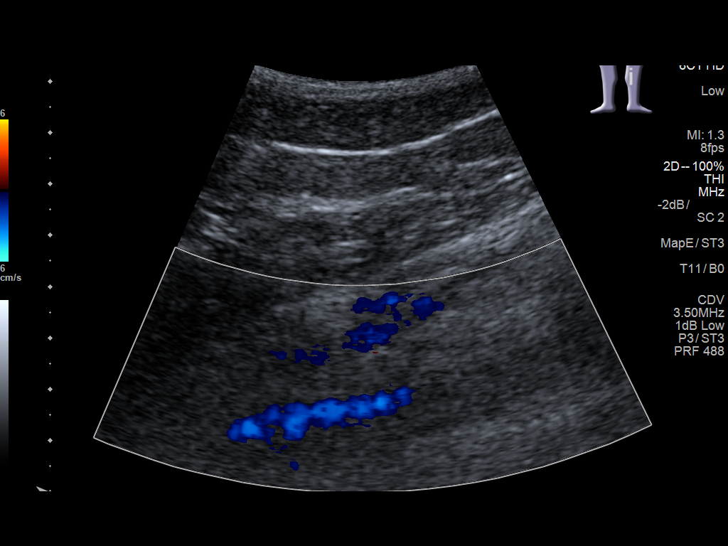

[13 of 24 positions shown; findings below may reference images not displayed]

FINDINGS: Technically difficult exam due to morbid obesity.

RIGHT LOWER EXTREMITY

Common Femoral Vein: No evidence of thrombus. Normal
compressibility, respiratory phasicity and response to augmentation.

Saphenofemoral Junction: No evidence of thrombus. Normal
compressibility and flow on color Doppler imaging.

Profunda Femoral Vein: No evidence of thrombus. Normal
compressibility and flow on color Doppler imaging.

Femoral Vein: No evidence of thrombus. Normal compressibility,
respiratory phasicity and response to augmentation.

Popliteal Vein: No evidence of thrombus. Normal compressibility,
respiratory phasicity and response to augmentation.

Calf Veins: No evidence of thrombus. Normal compressibility and flow
on color Doppler imaging.

Superficial Great Saphenous Vein: No evidence of thrombus. Normal
compressibility and flow on color Doppler imaging.

Venous Reflux:  None.

Other Findings:  None.

LEFT LOWER EXTREMITY

Common Femoral Vein: No evidence of thrombus. Normal
compressibility, respiratory phasicity and response to augmentation.

Saphenofemoral Junction: No evidence of thrombus. Normal
compressibility and flow on color Doppler imaging.

Profunda Femoral Vein: No evidence of thrombus. Normal
compressibility and flow on color Doppler imaging.

Femoral Vein: No evidence of thrombus. Normal compressibility,
respiratory phasicity and response to augmentation.

Popliteal Vein: No evidence of thrombus. Normal compressibility,
respiratory phasicity and response to augmentation.

Calf Veins: No evidence of thrombus. Normal compressibility and flow
on color Doppler imaging.

Superficial Great Saphenous Vein: No evidence of thrombus. Normal
compressibility and flow on color Doppler imaging.

Venous Reflux:  None.

Other Findings:  None.
IMPRESSION: No evidence of deep venous thrombosis.

## 2018-03-08 ENCOUNTER — Other Ambulatory Visit: Payer: Self-pay

## 2018-03-08 ENCOUNTER — Observation Stay
Admission: EM | Admit: 2018-03-08 | Discharge: 2018-03-09 | Disposition: A | Discharge status: Home / Self Care | Payer: Medicaid Other | Attending: Internal Medicine | Admitting: Internal Medicine

## 2018-03-08 ENCOUNTER — Emergency Department: Payer: Medicaid Other

## 2018-03-08 ENCOUNTER — Encounter: Payer: Self-pay | Admitting: Emergency Medicine

## 2018-03-08 DIAGNOSIS — Z7984 Long term (current) use of oral hypoglycemic drugs: Secondary | ICD-10-CM | POA: Insufficient documentation

## 2018-03-08 DIAGNOSIS — K219 Gastro-esophageal reflux disease without esophagitis: Secondary | ICD-10-CM | POA: Insufficient documentation

## 2018-03-08 DIAGNOSIS — Z79899 Other long term (current) drug therapy: Secondary | ICD-10-CM | POA: Diagnosis not present

## 2018-03-08 DIAGNOSIS — Z87891 Personal history of nicotine dependence: Secondary | ICD-10-CM | POA: Insufficient documentation

## 2018-03-08 DIAGNOSIS — Z886 Allergy status to analgesic agent status: Secondary | ICD-10-CM | POA: Insufficient documentation

## 2018-03-08 DIAGNOSIS — I10 Essential (primary) hypertension: Secondary | ICD-10-CM | POA: Diagnosis not present

## 2018-03-08 DIAGNOSIS — F329 Major depressive disorder, single episode, unspecified: Secondary | ICD-10-CM | POA: Insufficient documentation

## 2018-03-08 DIAGNOSIS — Z6841 Body Mass Index (BMI) 40.0 and over, adult: Secondary | ICD-10-CM | POA: Diagnosis not present

## 2018-03-08 DIAGNOSIS — E119 Type 2 diabetes mellitus without complications: Secondary | ICD-10-CM | POA: Insufficient documentation

## 2018-03-08 DIAGNOSIS — E039 Hypothyroidism, unspecified: Secondary | ICD-10-CM | POA: Insufficient documentation

## 2018-03-08 DIAGNOSIS — R0789 Other chest pain: Secondary | ICD-10-CM | POA: Diagnosis not present

## 2018-03-08 DIAGNOSIS — R079 Chest pain, unspecified: Secondary | ICD-10-CM | POA: Diagnosis present

## 2018-03-08 DIAGNOSIS — G4730 Sleep apnea, unspecified: Secondary | ICD-10-CM | POA: Insufficient documentation

## 2018-03-08 LAB — BASIC METABOLIC PANEL
ANION GAP: 9 (ref 5–15)
BUN: 9 mg/dL (ref 6–20)
CO2: 32 mmol/L (ref 22–32)
Calcium: 8.9 mg/dL (ref 8.9–10.3)
Chloride: 97 mmol/L — ABNORMAL LOW (ref 98–111)
Creatinine, Ser: 0.8 mg/dL (ref 0.44–1.00)
GFR calc Af Amer: >60 mL/min (ref >60)
GFR calc non Af Amer: >60 mL/min (ref >60)
GLUCOSE: 158 mg/dL — AB (ref 70–99)
POTASSIUM: 3.9 mmol/L (ref 3.5–5.1)
Sodium: 138 mmol/L (ref 135–145)

## 2018-03-08 LAB — CBC
HEMATOCRIT: 40.1 % (ref 35.0–47.0)
Hemoglobin: 13.3 g/dL (ref 12.0–16.0)
MCH: 25.2 pg — AB (ref 26.0–34.0)
MCHC: 33.1 g/dL (ref 32.0–36.0)
MCV: 76.2 fL — ABNORMAL LOW (ref 80.0–100.0)
Platelets: 255 10*3/uL (ref 150–440)
RBC: 5.26 MIL/uL — AB (ref 3.80–5.20)
RDW: 16.5 % — ABNORMAL HIGH (ref 11.5–14.5)
WBC: 7.2 10*3/uL (ref 3.6–11.0)

## 2018-03-08 LAB — TSH: TSH: 1.549 u[IU]/mL (ref 0.350–4.500)

## 2018-03-08 LAB — GLUCOSE, CAPILLARY: Glucose-Capillary: 204 mg/dL — ABNORMAL HIGH (ref 70–99)

## 2018-03-08 LAB — TROPONIN I
Troponin I: <0.03 ng/mL (ref <0.03)
Troponin I: <0.03 ng/mL (ref <0.03)

## 2018-03-08 MED ORDER — ASPIRIN EC 325 MG PO TBEC
325.0000 mg | DELAYED_RELEASE_TABLET | Freq: Every day | ORAL | Status: DC
  Administered 2018-03-09: 325 mg via ORAL
  Filled 2018-03-08: qty 1

## 2018-03-08 MED ORDER — LISINOPRIL 20 MG PO TABS
20.0000 mg | ORAL_TABLET | Freq: Every day | ORAL | Status: DC
  Administered 2018-03-09: 20 mg via ORAL
  Filled 2018-03-08: qty 1

## 2018-03-08 MED ORDER — INSULIN ASPART 100 UNIT/ML ~~LOC~~ SOLN
0.0000 [IU] | Freq: Every day | SUBCUTANEOUS | Status: DC
  Administered 2018-03-08: 2 [IU] via SUBCUTANEOUS
  Filled 2018-03-08: qty 1

## 2018-03-08 MED ORDER — NITROGLYCERIN 0.4 MG SL SUBL: 0.4000 mg | SUBLINGUAL_TABLET | SUBLINGUAL | Status: DC | PRN

## 2018-03-08 MED ORDER — ATORVASTATIN CALCIUM 20 MG PO TABS: 40.0000 mg | ORAL_TABLET | Freq: Every day | ORAL | Status: DC

## 2018-03-08 MED ORDER — GI COCKTAIL ~~LOC~~: 30.0000 mL | Freq: Four times a day (QID) | ORAL | Status: DC | PRN

## 2018-03-08 MED ORDER — HYDROCHLOROTHIAZIDE 25 MG PO TABS
25.0000 mg | ORAL_TABLET | Freq: Every day | ORAL | Status: DC
  Administered 2018-03-09: 25 mg via ORAL
  Filled 2018-03-08: qty 1

## 2018-03-08 MED ORDER — LEVOTHYROXINE SODIUM 100 MCG PO TABS
200.0000 ug | ORAL_TABLET | Freq: Every day | ORAL | Status: DC
  Administered 2018-03-09: 200 ug via ORAL
  Filled 2018-03-08: qty 2

## 2018-03-08 MED ORDER — INSULIN ASPART 100 UNIT/ML ~~LOC~~ SOLN
0.0000 [IU] | Freq: Three times a day (TID) | SUBCUTANEOUS | Status: DC
  Administered 2018-03-09: 3 [IU] via SUBCUTANEOUS
  Filled 2018-03-08: qty 1

## 2018-03-08 MED ORDER — ENOXAPARIN SODIUM 40 MG/0.4ML ~~LOC~~ SOLN
40.0000 mg | Freq: Two times a day (BID) | SUBCUTANEOUS | Status: DC
  Administered 2018-03-09: 40 mg via SUBCUTANEOUS
  Filled 2018-03-08: qty 0.4

## 2018-03-08 MED ORDER — METOPROLOL TARTRATE 25 MG PO TABS
25.0000 mg | ORAL_TABLET | Freq: Two times a day (BID) | ORAL | Status: DC
  Administered 2018-03-08 – 2018-03-09 (×2): 25 mg via ORAL
  Filled 2018-03-08 (×2): qty 1

## 2018-03-08 MED ORDER — ALPRAZOLAM 0.5 MG PO TABS: 0.2500 mg | ORAL_TABLET | Freq: Two times a day (BID) | ORAL | Status: DC | PRN

## 2018-03-08 MED ORDER — LISINOPRIL-HYDROCHLOROTHIAZIDE 20-25 MG PO TABS: 1.0000 | ORAL_TABLET | Freq: Every day | ORAL | Status: DC

## 2018-03-08 MED ORDER — ONDANSETRON HCL 4 MG/2ML IJ SOLN
4.0000 mg | Freq: Four times a day (QID) | INTRAMUSCULAR | Status: DC | PRN
  Administered 2018-03-08: 4 mg via INTRAVENOUS
  Filled 2018-03-08: qty 2

## 2018-03-08 MED ORDER — MORPHINE SULFATE (PF) 2 MG/ML IV SOLN: 2.0000 mg | INTRAVENOUS | Status: DC | PRN

## 2018-03-08 MED ORDER — FUROSEMIDE 20 MG PO TABS
20.0000 mg | ORAL_TABLET | Freq: Every day | ORAL | Status: DC
  Administered 2018-03-09: 20 mg via ORAL
  Filled 2018-03-08: qty 1

## 2018-03-08 MED ORDER — ZOLPIDEM TARTRATE 5 MG PO TABS: 5.0000 mg | ORAL_TABLET | Freq: Every evening | ORAL | Status: DC | PRN

## 2018-03-08 MED ORDER — FLUTICASONE PROPIONATE 50 MCG/ACT NA SUSP
1.0000 | Freq: Every day | NASAL | Status: DC
  Filled 2018-03-08: qty 16

## 2018-03-08 NOTE — Progress Notes (Signed)
Lovenox changed to 40 mg BID for CrCl >30 and BMI >40. 

## 2018-03-08 NOTE — ED Provider Notes (Signed)
[facility] Emergency Department Provider Note   ____________________________________________    I have reviewed the triage vital signs and the nursing notes.   HISTORY  Chief Complaint Chest Pain     HPI Sierra Pineda is a 42 y.o. female who presents with complaints of chest pain.  Patient reports she was at work today and developed left anterior chest pain which apparently rated down to her left arm.  She reports it seemed to get better but then she was at a store ambulating and she became nauseated and developed chest pain again.  This has improved but she reports mild chest tightness currently.  No history of heart disease.  Denies shortness of breath.  No lower extreme the swelling.  Does have a history of diabetes.  Past Medical History:  Diagnosis Date  . Anemia   . Depression   . Diabetes mellitus without complication (HCC)   . Dyspnea   . GERD (gastroesophageal reflux disease)   . Hypertension   . Hypothyroidism   . Neck mass 2014  . Sleep apnea   . Thyroid disease     Patient Active Problem List   Diagnosis Date Noted  . Chest pain 03/08/2018  . Abdominal pain, right upper quadrant 12/30/2012    Past Surgical History:  Procedure Laterality Date  . CESAREAN SECTION    . CHOLECYSTECTOMY  2014   Dr Cecelia ByarsHashmi  . COLONOSCOPY WITH PROPOFOL N/A 01/09/2017   Procedure: COLONOSCOPY WITH PROPOFOL;  Surgeon: Scot JunElliott, Mashayla Lavin T, MD;  Location: [facility] ENDOSCOPY;  Service: Endoscopy;  Laterality: N/A;  . ESOPHAGOGASTRODUODENOSCOPY (EGD) WITH PROPOFOL N/A 01/09/2017   Procedure: ESOPHAGOGASTRODUODENOSCOPY (EGD) WITH PROPOFOL;  Surgeon: Scot JunElliott, Corah Willeford T, MD;  [facility];  Service: Endoscopy;  Laterality: N/A;  . NECK MASS EXCISION  2014   Dr Willeen CassBennett  . TONSILLECTOMY    . TOTAL THYROIDECTOMY      Prior to Admission medications   Medication Sig Start Date End Date Taking? Authorizing Provider  amLODipine (NORVASC) 5 MG tablet Take  5 mg by mouth daily.    [provider]  fluticasone (FLONASE) 50 MCG/ACT nasal spray Place into both nostrils daily.    [provider]  furosemide (LASIX) 20 MG tablet Take 20 mg by mouth.    [provider]  levothyroxine (SYNTHROID, LEVOTHROID) 75 MCG tablet Take 75 mcg by mouth daily before breakfast.    [provider]     Allergies Chocolate flavor; Other; and Tylenol [acetaminophen]  No family history on file.  Social History Social History   Tobacco Use  . Smoking status: Former Smoker    Types: Cigarettes    Last attempt to quit: 08/16/2016    Years since quitting: 1.5  . Smokeless tobacco: Never Used  Substance Use Topics  . Alcohol use: No  . Drug use: No    Review of Systems  Constitutional: No fever/chills Eyes: No visual changes.  ENT: No sore throat. Cardiovascular: As above Respiratory: Denies shortness of breath. Gastrointestinal: No abdominal pain.  No nausea, no vomiting.   Genitourinary: Negative for dysuria. Musculoskeletal: Negative for back pain. Skin: Negative for rash. Neurological: Negative for headaches    ____________________________________________   PHYSICAL EXAM:  VITAL SIGNS: ED Triage Vitals  Enc Vitals Group     BP 03/08/18 1424 (!) 159/98     Pulse Rate 03/08/18 1424 73     Resp 03/08/18 1424 16     Temp 03/08/18 1424 98 F (36.7  C)     Temp Source 03/08/18 1424 Oral     SpO2 03/08/18 1424 95 %     Weight 03/08/18 1428 (!) 167.8 kg (369 lb 14.9 oz)     Height --      Head Circumference --      Peak Flow --      Pain Score 03/08/18 1427 0     Pain Loc --      Pain Edu? --      Excl. in GC? --     Constitutional: Alert and oriented.   Nose: No congestion/rhinnorhea. Mouth/Throat: Mucous membranes are moist.    Cardiovascular: Normal rate, regular rhythm. Grossly normal heart sounds.  Good peripheral circulation. Respiratory: Normal respiratory effort.  No retractions. Lungs  CTAB. Gastrointestinal: Soft and nontender. No distention.  No CVA tenderness. Genitourinary: deferred Musculoskeletal: No lower extremity tenderness nor edema.  Warm and well perfused Neurologic:  Normal speech and language. No gross focal neurologic deficits are appreciated.  Skin:  Skin is warm, dry and intact. No rash noted. Psychiatric: Mood and affect are normal. Speech and behavior are normal.  ____________________________________________   LABS (all labs ordered are listed, but only abnormal results are displayed)  Labs Reviewed  BASIC METABOLIC PANEL - Abnormal; Notable for the following components:      Result Value   Chloride 97 (*)    Glucose, Bld 158 (*)    All other components within normal limits  CBC - Abnormal; Notable for the following components:   RBC 5.26 (*)    MCV 76.2 (*)    MCH 25.2 (*)    RDW 16.5 (*)    All other components within normal limits  TROPONIN I  POC URINE PREG, ED   ____________________________________________  EKG  ED ECG REPORT I, Jene Every, the attending physician, personally viewed and interpreted this ECG.  Date: 03/08/2018  Rhythm: normal sinus rhythm QRS Axis: normal Intervals: normal ST/T Wave abnormalities: normal Narrative Interpretation: no evidence of acute ischemia  ____________________________________________  RADIOLOGY  Chest x-ray unremarkable ____________________________________________   PROCEDURES  Procedure(s) performed: No  Procedures   Critical Care performed: No ____________________________________________   INITIAL IMPRESSION / ASSESSMENT AND PLAN / ED COURSE  Pertinent labs & imaging results that were available during my care of the patient were reviewed by me and considered in my medical decision making (see chart for details).  Patient well-appearing overall, vitals unremarkable.  Lab work is reassuring, troponin normal.  No evidence of ACS on EKG.  She continues to have mild  pressure-like chest pain.  Will admit to the hospitalist service for further evaluation given history of diabetes, obesity    ____________________________________________   FINAL CLINICAL IMPRESSION(S) / ED DIAGNOSES  Final diagnoses:  Chest pain, unspecified type        Note:  This document was prepared using Dragon voice recognition software and may include unintentional dictation errors.    Jene Every, MD 03/08/18 Serena Croissant

## 2018-03-08 NOTE — ED Notes (Signed)
Pt has chest pain. Offered pt Nitroglycerin or Morphine. States Morphine makes her feel sick and states she does not want the rebound headache from Nitroglycerin. Alisa, RN on 2A made aware that pt did not wish to receive these meds at this time.

## 2018-03-08 NOTE — ED Triage Notes (Signed)
Pt to ED via POV c/o chest pain that started today around 11 am. Pt states that the pain was on the left side of her chest and radiated down her left arm. Pt also states that she had dizziness and shortness of breath. Pt states that the pain has eased off. Pt is tearful in triage.

## 2018-03-08 NOTE — H&P (Signed)
[employer] -  at [facility]   PATIENT NAME: Sierra Pineda    MR#:  [id number]  DATE OF BIRTH:  January 24, 1976  DATE OF ADMISSION:  03/08/2018  PRIMARY CARE PHYSICIAN: Oswaldo Conroy, MD   REQUESTING/REFERRING PHYSICIAN: Dr. Cyril Loosen  CHIEF COMPLAINT:   Chief Complaint  Patient presents with  . Chest Pain   Chest pain today. HISTORY OF PRESENT ILLNESS:  Sierra Pineda  is a 42 y.o. female with a known history of multiple medical problems as below.  The patient complains of chest pain today, which is on the left side, sharp and heavy, intermittent without radiation.  The patient denies any nausea, diaphoresis, shortness of breath or wheezing.  The first troponin level is normal.  Dr. Cyril Loosen requests admission.  PAST MEDICAL HISTORY:   Past Medical History:  Diagnosis Date  . Anemia   . Depression   . Diabetes mellitus without complication (HCC)   . Dyspnea   . GERD (gastroesophageal reflux disease)   . Hypertension   . Hypothyroidism   . Neck mass 2014  . Sleep apnea   . Thyroid disease     PAST SURGICAL HISTORY:   Past Surgical History:  Procedure Laterality Date  . CESAREAN SECTION    . CHOLECYSTECTOMY  2014   Dr Cecelia Byars  . COLONOSCOPY WITH PROPOFOL N/A 01/09/2017   Procedure: COLONOSCOPY WITH PROPOFOL;  Surgeon: Scot Jun, MD;  Location: [facility] ENDOSCOPY;  Service: Endoscopy;  Laterality: N/A;  . ESOPHAGOGASTRODUODENOSCOPY (EGD) WITH PROPOFOL N/A 01/09/2017   Procedure: ESOPHAGOGASTRODUODENOSCOPY (EGD) WITH PROPOFOL;  Surgeon: Scot Jun, MD;  Location: [facility] ENDOSCOPY;  Service: Endoscopy;  Laterality: N/A;  . NECK MASS EXCISION  2014   Dr Willeen Cass  . TONSILLECTOMY    . TOTAL THYROIDECTOMY      SOCIAL HISTORY:   Social History   Tobacco Use  . Smoking status: Former Smoker    Types: Cigarettes    Last attempt to quit: 08/16/2016    Years since quitting: 1.5  . Smokeless tobacco: Never Used  Substance Use Topics  .  Alcohol use: No    FAMILY HISTORY:   Family History  Problem Relation Age of Onset  . Atrial fibrillation Mother   . Hypertension Mother   . Renal Disease Father   . Heart attack Father     DRUG ALLERGIES:   Allergies  Allergen Reactions  . Chocolate Flavor Hives  . Other     chocolate  . Tylenol [Acetaminophen] Rash and Other (See Comments)    Yeast Infection    REVIEW OF SYSTEMS:   Review of Systems  Constitutional: Negative for chills, fever and malaise/fatigue.  HENT: Negative for sore throat.   Eyes: Negative for blurred vision and double vision.  Respiratory: Negative for cough, hemoptysis, shortness of breath, wheezing and stridor.   Cardiovascular: Positive for chest pain. Negative for palpitations, orthopnea and leg swelling.  Gastrointestinal: Negative for abdominal pain, blood in stool, diarrhea, melena, nausea and vomiting.  Genitourinary: Negative for dysuria, flank pain and hematuria.  Musculoskeletal: Negative for back pain and joint pain.  Skin: Negative for rash.  Neurological: Negative for dizziness, sensory change, focal weakness, seizures, loss of consciousness, weakness and headaches.  Endo/Heme/Allergies: Negative for polydipsia.  Psychiatric/Behavioral: Negative for depression. The patient is not nervous/anxious.     MEDICATIONS AT HOME:   Prior to Admission medications   Medication Sig Start Date End Date Taking? Authorizing Provider  levothyroxine (SYNTHROID, LEVOTHROID) 200 MCG tablet Take  200 mcg by mouth daily before breakfast.   Yes [provider]  levothyroxine (SYNTHROID, LEVOTHROID) 75 MCG tablet Take 75 mcg by mouth daily before breakfast.   Yes [provider]  lisinopril-hydrochlorothiazide (PRINZIDE,ZESTORETIC) 20-25 MG tablet Take 1 tablet by mouth daily.   Yes [provider]  metFORMIN (GLUCOPHAGE) 500 MG tablet Take 500 mg by mouth 2 (two) times daily. 08/21/17   [provider]      VITAL  SIGNS:  Blood pressure (!) 146/81, pulse 82, temperature 98 F (36.7 C), temperature source Oral, resp. rate (!) 27, weight (!) 167.8 kg, SpO2 95 %.  PHYSICAL EXAMINATION:  Physical Exam  GENERAL:  42 y.o.-year-old patient lying in the bed with no acute distress.  Morbid obesity. EYES: Pupils equal, round, reactive to light and accommodation. No scleral icterus. Extraocular muscles intact.  HEENT: Head atraumatic, normocephalic. Oropharynx and nasopharynx clear.  NECK:  Supple, no jugular venous distention. No thyroid enlargement, no tenderness.  LUNGS: Diminished breath sounds bilaterally, no wheezing, rales,rhonchi or crepitation. No use of accessory muscles of respiration.  CARDIOVASCULAR: S1, S2 normal. No murmurs, rubs, or gallops.  ABDOMEN: Soft, nontender, nondistended. Bowel sounds present. No organomegaly or mass.  EXTREMITIES: No pedal edema, cyanosis, or clubbing.  NEUROLOGIC: Cranial nerves II through XII are intact. Muscle strength 5/5 in all extremities. Sensation intact. Gait not checked.  PSYCHIATRIC: The patient is alert and oriented x 3.  SKIN: No obvious rash, lesion, or ulcer.   LABORATORY PANEL:   CBC Recent Labs  Lab 03/08/18 1433  WBC 7.2  HGB 13.3  HCT 40.1  PLT 255   ------------------------------------------------------------------------------------------------------------------  Chemistries  Recent Labs  Lab 03/08/18 1433  NA 138  K 3.9  CL 97*  CO2 32  GLUCOSE 158*  BUN 9  CREATININE 0.80  CALCIUM 8.9   ------------------------------------------------------------------------------------------------------------------  Cardiac Enzymes Recent Labs  Lab 03/08/18 1433  TROPONINI <0.03   ------------------------------------------------------------------------------------------------------------------  RADIOLOGY:  Dg Chest 2 View  Result Date: 03/08/2018 CLINICAL DATA:  42 year old with chest pain. EXAM: CHEST - 2 VIEW COMPARISON:   11/28/2015 FINDINGS: Chronic elevation of the right hemidiaphragm. Scarring or atelectasis at the right lung base. Coarse lung markings appear chronic. Heart size is normal. No large pleural effusions. No acute bone abnormality. IMPRESSION: No active cardiopulmonary disease. Chronic elevation of the right hemidiaphragm with volume loss at the right lung base. Electronically Signed   By: Richarda OverlieAdam  Henn M.D.   On: 03/08/2018 15:38      IMPRESSION AND PLAN:   Chest pain, rule out ACS. Patient will be admitted to telemetry floor. Start aspirin, Lipitor, follow-up with troponin level and lipid panel.  Follow-up cardiology consult.  Diabetes.  Start sliding scale, hold metformin. Hypertension, continue lisinopril-HCTZ. Morbid obesity.  Check lipid panel.  All the records are reviewed and case discussed with ED provider. Management plans discussed with the patient, her parents and they are in agreement.  CODE STATUS: Full code  TOTAL TIME TAKING CARE OF THIS PATIENT: 38 minutes.    Shaune PollackQing Romanda Turrubiates M.D on 03/08/2018 at 7:25 PM  Between 7am to 6pm - Pager - [phone]  After 6pm go to [email or web address] - Social research officer, governmentpassword [email or web address]  [facility] [place] [facility]  Office  [phone]  CC: Primary care physician; Oswaldo ConroyBender, Abby Daneele, MD   Note: This dictation was prepared with Dragon dictation along with smaller phrase technology. Any transcriptional errors that result from this process are unin

## 2018-03-09 LAB — LIPID PANEL
CHOLESTEROL: 165 mg/dL (ref 0–200)
HDL: 34 mg/dL — ABNORMAL LOW (ref >40)
LDL Cholesterol: 112 mg/dL — ABNORMAL HIGH (ref 0–99)
Total CHOL/HDL Ratio: 4.9 RATIO
Triglycerides: 95 mg/dL (ref <150)
VLDL: 19 mg/dL (ref 0–40)

## 2018-03-09 LAB — GLUCOSE, CAPILLARY: Glucose-Capillary: 210 mg/dL — ABNORMAL HIGH (ref 70–99)

## 2018-03-09 LAB — TROPONIN I: Troponin I: <0.03 ng/mL (ref <0.03)

## 2018-03-09 NOTE — Progress Notes (Signed)
Advanced care plan.  Purpose of the Encounter: CODE STATUS  Parties in Attendance: Patient  Patient's Decision Capacity: Good  Subjective/Patient's story: Presented to emergency room for chest pain   Objective/Medical story Has history of diabetes mellitus, hypertension hypothyroidism needs cardiac work-up   Goals of care determination:  Advance care directives goals of care discussed Patient wants everything done which includes CPR and intubation if the need arises   CODE STATUS: Full code   Time spent discussing advanced care planning: 16 minutes

## 2018-03-09 NOTE — Discharge Summary (Signed)
[facility] - [place] [facility]   PATIENT NAME: Sierra Pineda    MR#:  [id number]  DATE OF BIRTH:  October 18, 1975  DATE OF ADMISSION:  03/08/2018 ADMITTING PHYSICIAN: Shaune Pollack, MD  DATE OF DISCHARGE: 03/09/2018  PRIMARY CARE PHYSICIAN: Oswaldo Conroy, MD   ADMISSION DIAGNOSIS:  Chest pain, unspecified type [R07.9] Hypertension Hypothyroidism Type 2 diabetes mellitus DISCHARGE DIAGNOSIS:  Atypical chest pain Hypertension Hypothyroidism Type 2 diabetes mellitus  SECONDARY DIAGNOSIS:   Past Medical History:  Diagnosis Date  . Anemia   . Depression   . Diabetes mellitus without complication (HCC)   . Dyspnea   . GERD (gastroesophageal reflux disease)   . Hypertension   . Hypothyroidism   . Neck mass 2014  . Sleep apnea   . Thyroid disease      ADMITTING HISTORY Sierra Pineda  is a 42 y.o. female with a known history of multiple medical problems as below.  The patient complains of chest pain today, which is on the left side, sharp and heavy, intermittent without radiation.  The patient denies any nausea, diaphoresis, shortness of breath or wheezing.  The first troponin level is normal.  Dr. Cyril Loosen requests admission  HOSPITAL COURSE:  Patient was admitted to telemetry.  Serial troponins were completely negative.  Lipid panel was checked total cholesterol 165, HDL 34 and LDL 112.  Patient was seen by cardiology Dr. Lady Gary who recommended outpatient stress test.  Patient was completely chest pain-free during the hospitalization.  Patient received aspirin, statin, beta-blocker and ACE inhibitor during the hospitalization.  DVT prophylaxis was given with subcu Lovenox.  Patient hemodynamically stable will be discharged home.  CONSULTS OBTAINED:  Treatment Team:  Dalia Heading, MD  DRUG ALLERGIES:   Allergies  Allergen Reactions  . Chocolate Flavor Hives  . Other     chocolate  . Tylenol [Acetaminophen] Rash and Other (See Comments)    Yeast  Infection    DISCHARGE MEDICATIONS:   Allergies as of 03/09/2018      Reactions   Chocolate Flavor Hives   Other    chocolate   Tylenol [acetaminophen] Rash, Other (See Comments)   Yeast Infection      Medication List    TAKE these medications   levothyroxine 75 MCG tablet Commonly known as:  SYNTHROID, LEVOTHROID Take 75 mcg by mouth daily before breakfast.   levothyroxine 200 MCG tablet Commonly known as:  SYNTHROID, LEVOTHROID Take 200 mcg by mouth daily before breakfast.   lisinopril-hydrochlorothiazide 20-25 MG tablet Commonly known as:  PRINZIDE,ZESTORETIC Take 1 tablet by mouth daily.   metFORMIN 500 MG tablet Commonly known as:  GLUCOPHAGE Take 500 mg by mouth 2 (two) times daily.       Today  Patient seen and evaluated today No chest pain No shortness of breath  VITAL SIGNS:  Blood pressure 119/60, pulse 72, temperature 97.6 F (36.4 C), resp. rate 14, height 5\' 6"  (1.676 m), weight (!) 174.6 kg, SpO2 94 %.  I/O:    Intake/Output Summary (Last 24 hours) at 03/09/2018 1024 Last data filed at 03/09/2018 0848 Gross per 24 hour  Intake -  Output 700 ml  Net -700 ml    PHYSICAL EXAMINATION:  Physical Exam  GENERAL:  42 y.o.-year-old obese patient lying in the bed with no acute distress.  LUNGS: Normal breath sounds bilaterally, no wheezing, rales,rhonchi or crepitation. No use of accessory muscles of respiration.  CARDIOVASCULAR: S1, S2 normal. No murmurs, rubs, or gallops.  ABDOMEN:  Soft, non-tender, non-distended. Bowel sounds present. No organomegaly or mass.  NEUROLOGIC: Moves all 4 extremities. PSYCHIATRIC: The patient is alert and oriented x 3.  SKIN: No obvious rash, lesion, or ulcer.   DATA REVIEW:   CBC Recent Labs  Lab 03/08/18 1433  WBC 7.2  HGB 13.3  HCT 40.1  PLT 255    Chemistries  Recent Labs  Lab 03/08/18 1433  NA 138  K 3.9  CL 97*  CO2 32  GLUCOSE 158*  BUN 9  CREATININE 0.80  CALCIUM 8.9    Cardiac  Enzymes Recent Labs  Lab 03/09/18 0501  TROPONINI <0.03    Microbiology Results  No results found for this or any previous visit.  RADIOLOGY:  Dg Chest 2 View  Result Date: 03/08/2018 CLINICAL DATA:  42 year old with chest pain. EXAM: CHEST - 2 VIEW COMPARISON:  11/28/2015 FINDINGS: Chronic elevation of the right hemidiaphragm. Scarring or atelectasis at the right lung base. Coarse lung markings appear chronic. Heart size is normal. No large pleural effusions. No acute bone abnormality. IMPRESSION: No active cardiopulmonary disease. Chronic elevation of the right hemidiaphragm with volume loss at the right lung base. Electronically Signed   By: Richarda OverlieAdam  Henn M.D.   On: 03/08/2018 15:38    Follow up with PCP in 1 week.  Management plans discussed with the patient, family and they are in agreement.  CODE STATUS: Full code    Code Status Orders  (From admission, onward)         Start     Ordered   03/08/18 2154  Full code  Continuous     03/08/18 2153        Code Status History    This patient has a current code status but no historical code status.      TOTAL TIME TAKING CARE OF THIS PATIENT ON DAY OF DISCHARGE: more than 35 minutes.   Ihor AustinPavan Chaden Doom M.D on 03/09/2018 at 10:24 AM  Between 7am to 6pm - Pager - [phone]  After 6pm go to [email or web address] - password [email or web address] [facility]  [facility] [place] [facility]  Office  [phone]  CC: Primary care physician; Oswaldo ConroyBender, Abby Daneele, MD  Note: This dictation was prepared with Dragon dictation along with smaller phrase technology. Any transcriptional errors that result from this process are unintentional.

## 2018-03-09 NOTE — Consult Note (Signed)
Cardiology Consultation Note    Patient ID: Sierra Pineda, MRN: [id number], DOB/AGE: 43/03/1976 43 y.o. Admit date: 03/08/2018   Date of Consult: 03/09/2018 Primary Physician: Oswaldo ConroyBender, Abby Daneele, MD Primary Cardiologist: Dr. Gwen PoundsKowalski  Chief Complaint: chest pain Reason for Consultation: chest pain Requesting MD: Dr. Tobi BastosPyreddy  HPI: Sierra Pineda is a 42 y.o. female with history of patient is a 42 year old female with history of hypertension, sleep apnea with moderate obesity who was admitted with chest pain.  Pain was somewhat atypical.  She was ruled out for myocardial infarction.  Electrocardiogram showed no ischemia.  She had had a functional study several years ago in preparation for possible bariatric surgery.  The functional study was unremarkable and she did not proceed with the surgical procedure.  She is currently pain-free.  She denies desires to go home with outpatient follow-up.  Past Medical History:  Diagnosis Date  . Anemia   . Depression   . Diabetes mellitus without complication (HCC)   . Dyspnea   . GERD (gastroesophageal reflux disease)   . Hypertension   . Hypothyroidism   . Neck mass 2014  . Sleep apnea   . Thyroid disease       Surgical History:  Past Surgical History:  Procedure Laterality Date  . CESAREAN SECTION    . CHOLECYSTECTOMY  2014   Dr Cecelia ByarsHashmi  . COLONOSCOPY WITH PROPOFOL N/A 01/09/2017   Procedure: COLONOSCOPY WITH PROPOFOL;  Surgeon: Scot JunElliott, Robert T, MD;  [facility];  Service: Endoscopy;  Laterality: N/A;  . ESOPHAGOGASTRODUODENOSCOPY (EGD) WITH PROPOFOL N/A 01/09/2017   Procedure: ESOPHAGOGASTRODUODENOSCOPY (EGD) WITH PROPOFOL;  Surgeon: Scot JunElliott, Robert T, MD;  [facility];  Service: Endoscopy;  Laterality: N/A;  . NECK MASS EXCISION  2014   Dr Willeen CassBennett  . TONSILLECTOMY    . TOTAL THYROIDECTOMY       Home Meds: Prior to Admission medications   Medication Sig Start Date End Date Taking? Authorizing Provider   levothyroxine (SYNTHROID, LEVOTHROID) 200 MCG tablet Take 200 mcg by mouth daily before breakfast.   Yes [provider]  levothyroxine (SYNTHROID, LEVOTHROID) 75 MCG tablet Take 75 mcg by mouth daily before breakfast.   Yes [provider]  lisinopril-hydrochlorothiazide (PRINZIDE,ZESTORETIC) 20-25 MG tablet Take 1 tablet by mouth daily.   Yes [provider]  metFORMIN (GLUCOPHAGE) 500 MG tablet Take 500 mg by mouth 2 (two) times daily. 08/21/17   [provider]    Inpatient Medications:  . aspirin EC  325 mg Oral Daily  . atorvastatin  40 mg Oral q1800  . enoxaparin (LOVENOX) injection  40 mg Subcutaneous BID  . fluticasone  1 spray Each Nare Daily  . furosemide  20 mg Oral Daily  . lisinopril  20 mg Oral Daily   And  . hydrochlorothiazide  25 mg Oral Daily  . insulin aspart  0-5 Units Subcutaneous QHS  . insulin aspart  0-9 Units Subcutaneous TID WC  . levothyroxine  200 mcg Oral QAC breakfast  . metoprolol tartrate  25 mg Oral BID     Allergies:  Allergies  Allergen Reactions  . Chocolate Flavor Hives  . Other     chocolate  . Tylenol [Acetaminophen] Rash and Other (See Comments)    Yeast Infection    Social History   Socioeconomic History  . Marital status: Single    Spouse name: Not on file  . Number of children: Not on file  . Years of education: Not on  file  . Highest education level: Not on file  Occupational History  . Not on file  Social Needs  . Financial resource strain: Not on file  . Food insecurity:    Worry: Not on file    Inability: Not on file  . Transportation needs:    Medical: Not on file    Non-medical: Not on file  Tobacco Use  . Smoking status: Former Smoker    Types: Cigarettes    Last attempt to quit: 08/16/2016    Years since quitting: 1.5  . Smokeless tobacco: Never Used  Substance and Sexual Activity  . Alcohol use: No  . Drug use: No  . Sexual activity: Not on file  Lifestyle  . Physical  activity:    Days per week: Not on file    Minutes per session: Not on file  . Stress: Not on file  Relationships  . Social connections:    Talks on phone: Not on file    Gets together: Not on file    Attends religious service: Not on file    Active member of club or organization: Not on file    Attends meetings of clubs or organizations: Not on file    Relationship status: Not on file  . Intimate partner violence:    Fear of current or ex partner: Not on file    Emotionally abused: Not on file    Physically abused: Not on file    Forced sexual activity: Not on file  Other Topics Concern  . Not on file  Social History Narrative  . Not on file     Family History  Problem Relation Age of Onset  . Atrial fibrillation Mother   . Hypertension Mother   . Renal Disease Father   . Heart attack Father      Review of Systems: A 12-system review of systems was performed and is negative except as noted in the HPI.  Labs: Recent Labs    03/08/18 1433 03/08/18 2311 03/09/18 0206 03/09/18 0501  TROPONINI <0.03 <0.03 <0.03 <0.03   Lab Results  Component Value Date   WBC 7.2 03/08/2018   HGB 13.3 03/08/2018   HCT 40.1 03/08/2018   MCV 76.2 (L) 03/08/2018   PLT 255 03/08/2018    Recent Labs  Lab 03/08/18 1433  NA 138  K 3.9  CL 97*  CO2 32  BUN 9  CREATININE 0.80  CALCIUM 8.9  GLUCOSE 158*   Lab Results  Component Value Date   CHOL 165 03/09/2018   HDL 34 (L) 03/09/2018   LDLCALC 112 (H) 03/09/2018   TRIG 95 03/09/2018   No results found for: DDIMER  Radiology/Studies:  Dg Chest 2 View  Result Date: 03/08/2018 CLINICAL DATA:  42 year old with chest pain. EXAM: CHEST - 2 VIEW COMPARISON:  11/28/2015 FINDINGS: Chronic elevation of the right hemidiaphragm. Scarring or atelectasis at the right lung base. Coarse lung markings appear chronic. Heart size is normal. No large pleural effusions. No acute bone abnormality. IMPRESSION: No active cardiopulmonary disease.  Chronic elevation of the right hemidiaphragm with volume loss at the right lung base. Electronically Signed   By: Richarda Overlie M.D.   On: 03/08/2018 15:38    Wt Readings from Last 3 Encounters:  03/08/18 (!) 174.6 kg  01/09/17 (!) 167.8 kg  10/08/16 (!) 167.8 kg    EKG: Sinus rhythm with nonspecific ST-T wave changes.  No ischemia  Physical Exam:  Blood pressure 119/60, pulse 72, temperature 97.6  F (36.4 C), resp. rate 14, height 5\' 6"  (1.676 m), weight (!) 174.6 kg, SpO2 94 %. Body mass index is 62.12 kg/m. General: Well developed, well nourished, in no acute distress. Head: Normocephalic, atraumatic, sclera non-icteric, no xanthomas, nares are without discharge.  Neck: Negative for carotid bruits. JVD not elevated. Lungs: Clear bilaterally to auscultation without wheezes, rales, or rhonchi. Breathing is unlabored. Heart: RRR with S1 S2. No murmurs, rubs, or gallops appreciated. Abdomen: Soft, non-tender, non-distended with normoactive bowel sounds. No hepatomegaly. No rebound/guarding. No obvious abdominal masses. Msk:  Strength and tone appear normal for age. Extremities: No clubbing or cyanosis. No edema.  Distal pedal pulses are 2+ and equal bilaterally. Neuro: Alert and oriented X 3. No facial asymmetry. No focal deficit. Moves all extremities spontaneously. Psych:  Responds to questions appropriately with a normal affect.     Assessment and Plan  42 year old female with history of hypertension hyperlipidemia diabetes and morbid obesity admitted with atypical chest pain.  She is ruled out for myocardial infarction.  Electrocardiogram was unremarkable.  Will need evaluation for possible ischemic etiology.  Discussed consideration for inpatient versus outpatient patient prefers outpatient evaluation due to family's needs at home.  I think this is acceptable given her symptoms.  We will ambulate this morning and if without chest pain will will allow discharge to home with outpatient  follow-up with Dr. Gwen Pounds.  Would have her contact him this week for early follow-up.  She has no further symptoms he will need to return to the hospital.  Signed, Dalia Heading MD 03/09/2018, 9:56 AM Pager: [phone]

## 2018-03-09 NOTE — Discharge Instructions (Signed)

## 2018-03-11 LAB — HIV ANTIBODY (ROUTINE TESTING W REFLEX): HIV SCREEN 4TH GENERATION: NONREACTIVE

## 2020-07-25 IMAGING — CR DG CHEST 2V
2 series · 2 of 2 positions shown · non-contrast
Comparison: 11/28/2015

CLINICAL DATA: 42-year-old with chest pain.

EXAM:
CHEST - 2 VIEW

[chest pa]
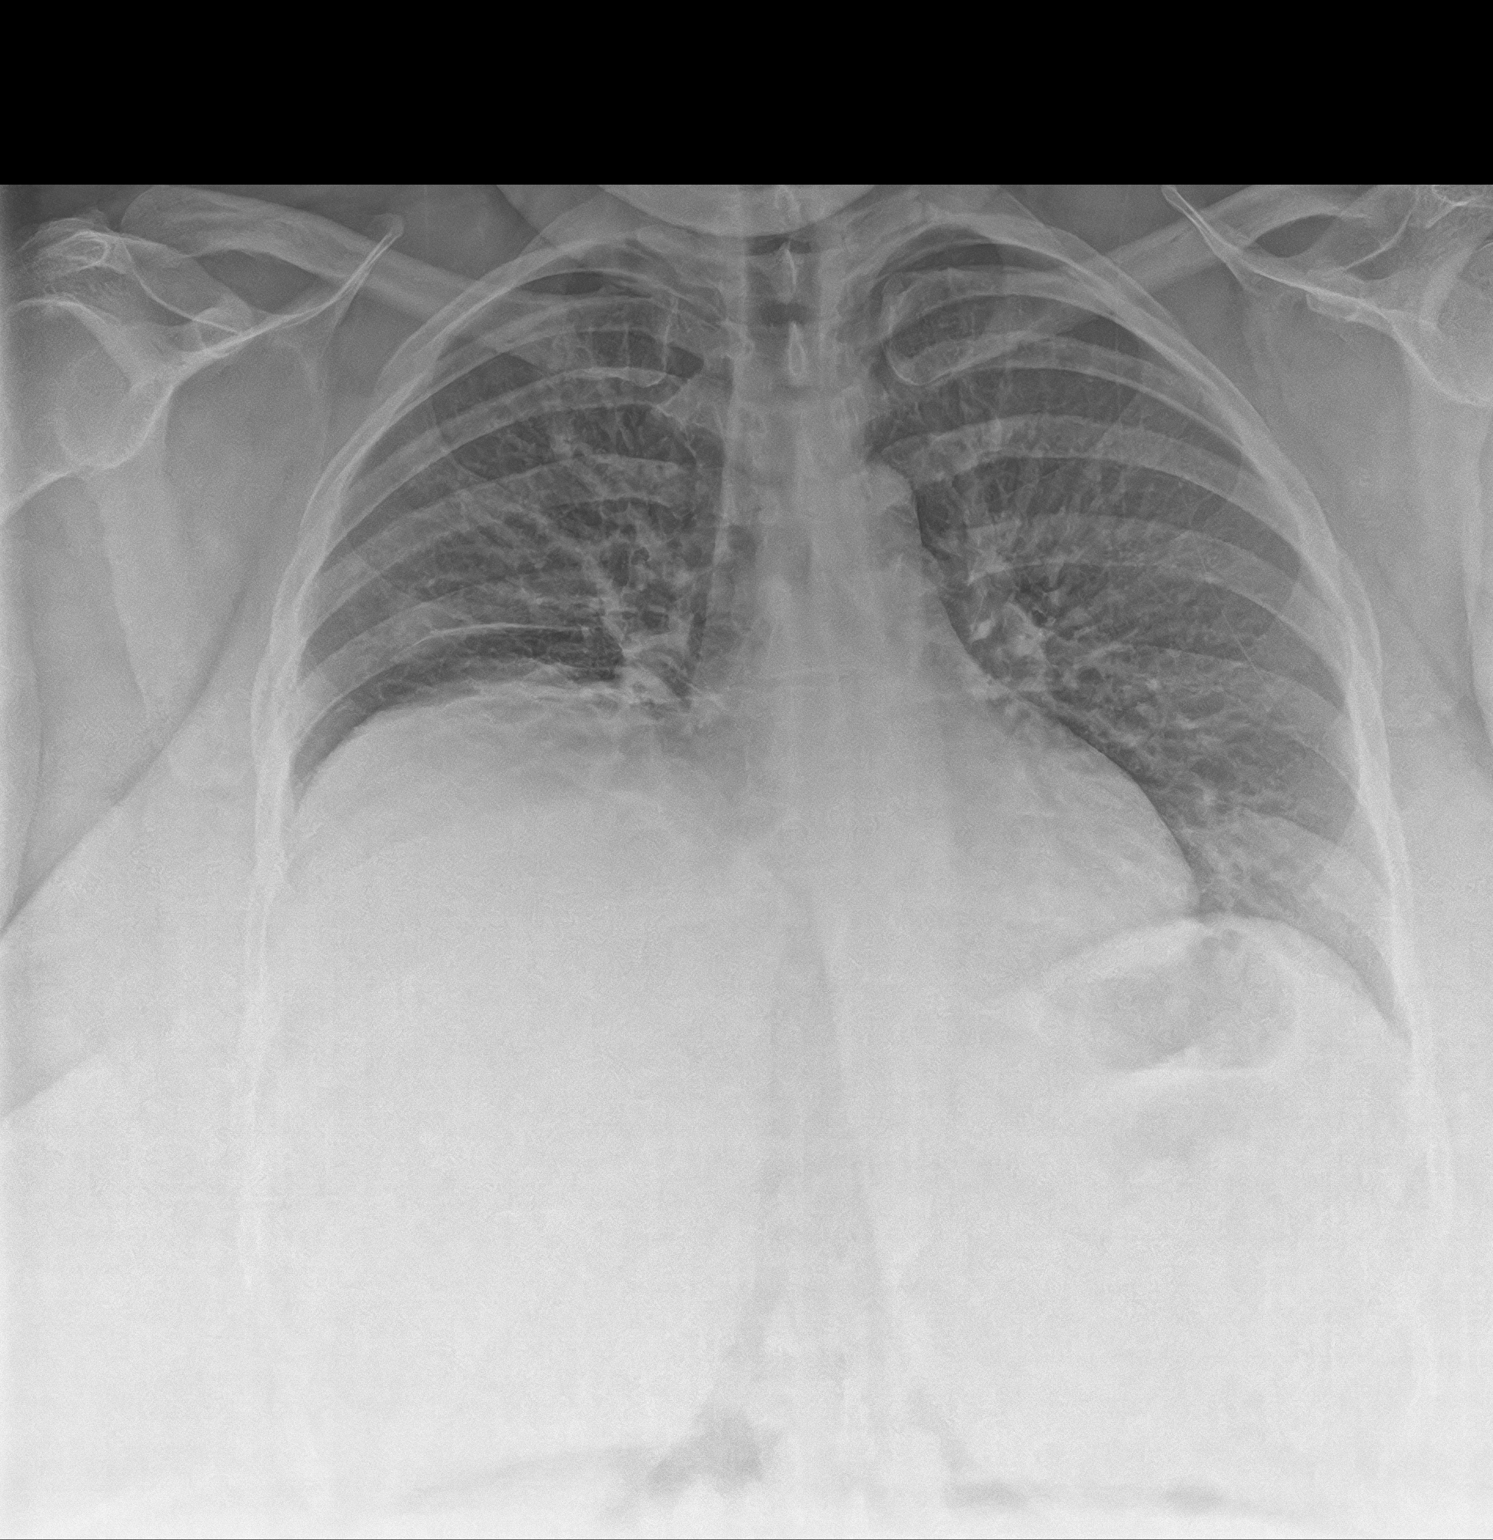

[chest lat]
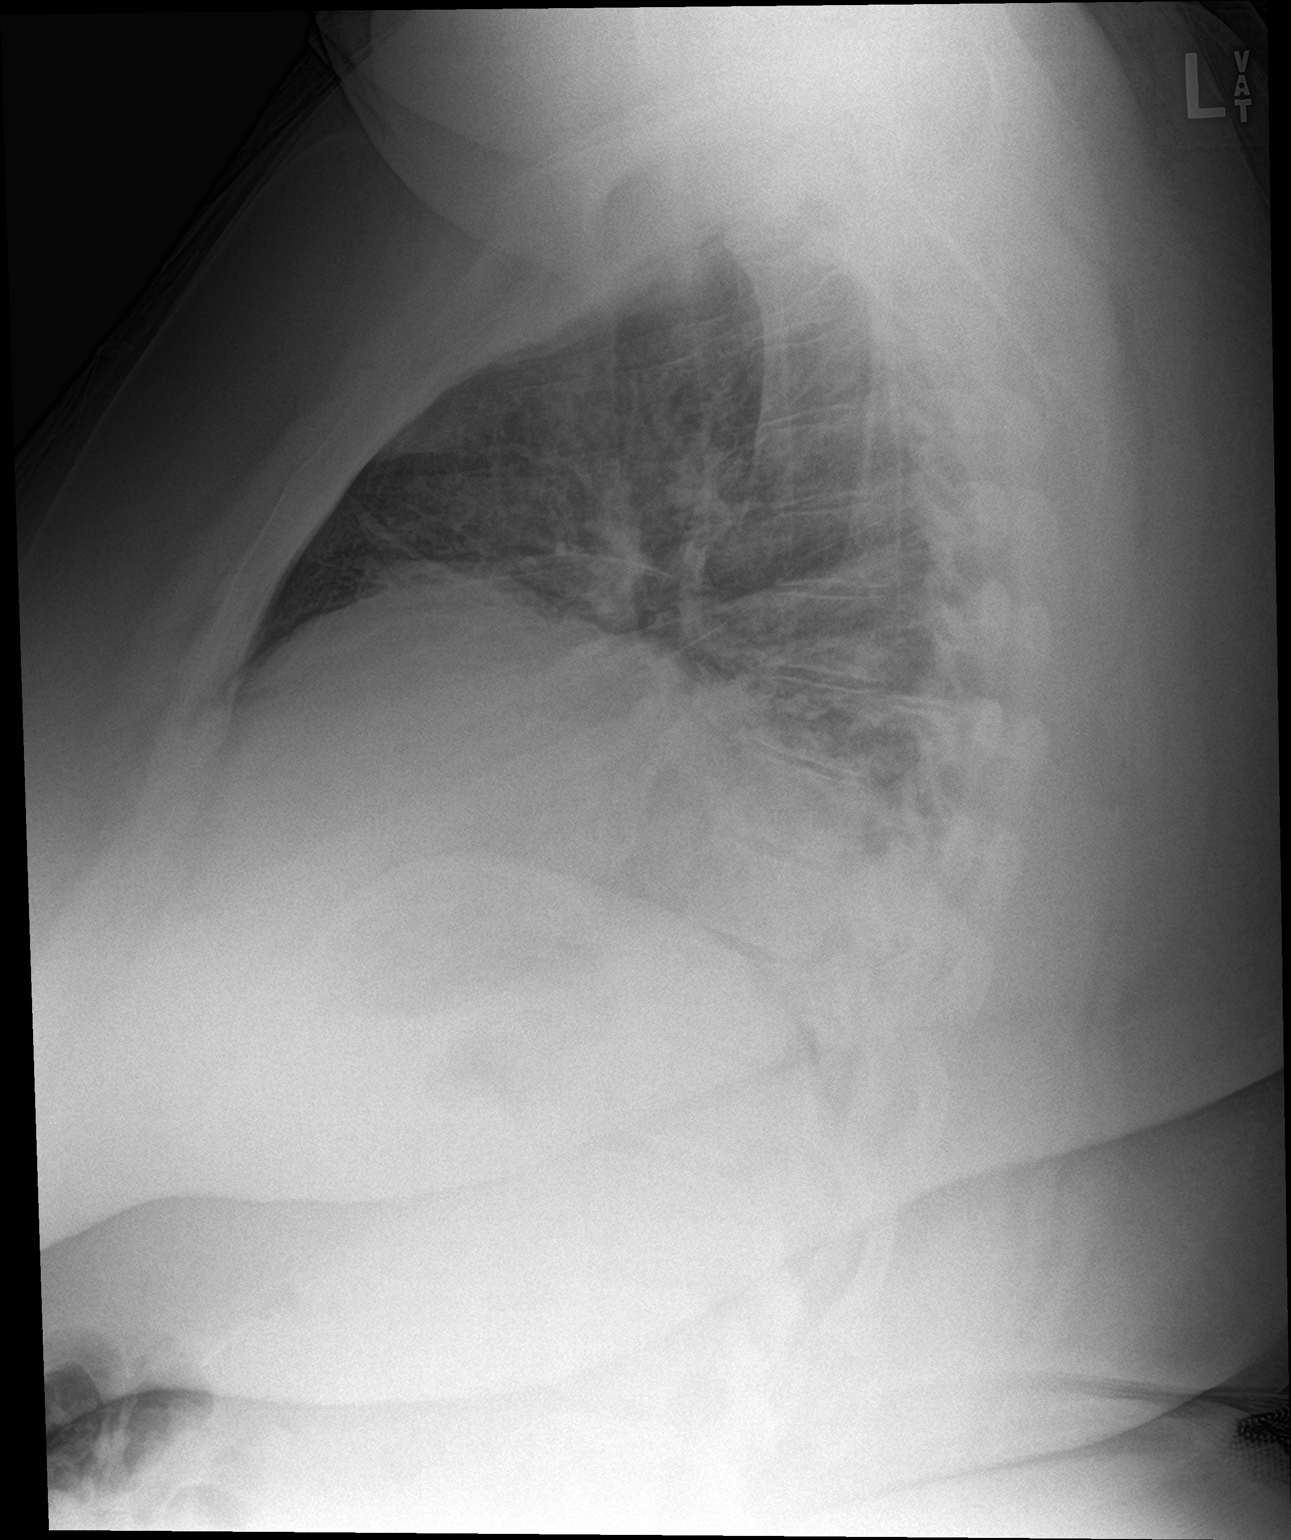

[2 of 2 positions shown; findings below may reference images not displayed]

FINDINGS: Chronic elevation of the right hemidiaphragm. Scarring or
atelectasis at the right lung base. Coarse lung markings appear
chronic. Heart size is normal. No large pleural effusions. No acute
bone abnormality.
IMPRESSION: No active cardiopulmonary disease.

Chronic elevation of the right hemidiaphragm with volume loss at the
right lung base.
